# Patient Record
Sex: Female | Born: 1935 | Race: White | Hispanic: No | Smoking: Never smoker
Health system: Southern US, Community
[De-identification: ages and names within clinical notes are randomized; demographics above are authoritative.]

## PROBLEM LIST (undated history)

## (undated) DIAGNOSIS — F419 Anxiety disorder, unspecified: Secondary | ICD-10-CM

## (undated) DIAGNOSIS — K922 Gastrointestinal hemorrhage, unspecified: Secondary | ICD-10-CM

## (undated) DIAGNOSIS — J690 Pneumonitis due to inhalation of food and vomit: Secondary | ICD-10-CM

## (undated) DIAGNOSIS — R002 Palpitations: Secondary | ICD-10-CM

## (undated) DIAGNOSIS — N390 Urinary tract infection, site not specified: Secondary | ICD-10-CM

## (undated) DIAGNOSIS — M1990 Unspecified osteoarthritis, unspecified site: Secondary | ICD-10-CM

## (undated) DIAGNOSIS — I10 Essential (primary) hypertension: Secondary | ICD-10-CM

## (undated) DIAGNOSIS — E785 Hyperlipidemia, unspecified: Secondary | ICD-10-CM

## (undated) DIAGNOSIS — T7840XA Allergy, unspecified, initial encounter: Secondary | ICD-10-CM

## (undated) DIAGNOSIS — R739 Hyperglycemia, unspecified: Secondary | ICD-10-CM

## (undated) DIAGNOSIS — K219 Gastro-esophageal reflux disease without esophagitis: Secondary | ICD-10-CM

## (undated) DIAGNOSIS — IMO0002 Reserved for concepts with insufficient information to code with codable children: Secondary | ICD-10-CM

## (undated) DIAGNOSIS — I639 Cerebral infarction, unspecified: Secondary | ICD-10-CM

## (undated) DIAGNOSIS — K5790 Diverticulosis of intestine, part unspecified, without perforation or abscess without bleeding: Secondary | ICD-10-CM

## (undated) HISTORY — DX: Essential (primary) hypertension: I10

## (undated) HISTORY — DX: Gastrointestinal hemorrhage, unspecified: K92.2

## (undated) HISTORY — DX: Anxiety disorder, unspecified: F41.9

## (undated) HISTORY — DX: Allergy, unspecified, initial encounter: T78.40XA

## (undated) HISTORY — DX: Unspecified osteoarthritis, unspecified site: M19.90

## (undated) HISTORY — PX: CHOLECYSTECTOMY: SHX55

## (undated) HISTORY — DX: Reserved for concepts with insufficient information to code with codable children: IMO0002

## (undated) HISTORY — PX: HEMORROIDECTOMY: SUR656

## (undated) HISTORY — DX: Palpitations: R00.2

## (undated) HISTORY — DX: Pneumonitis due to inhalation of food and vomit: J69.0

## (undated) HISTORY — DX: Gastro-esophageal reflux disease without esophagitis: K21.9

## (undated) HISTORY — DX: Diverticulosis of intestine, part unspecified, without perforation or abscess without bleeding: K57.90

## (undated) HISTORY — DX: Hyperlipidemia, unspecified: E78.5

## (undated) HISTORY — DX: Urinary tract infection, site not specified: N39.0

## (undated) HISTORY — PX: ABDOMINAL HYSTERECTOMY: SHX81

## (undated) HISTORY — DX: Hyperglycemia, unspecified: R73.9

---

## 2003-09-25 LAB — HM COLONOSCOPY: HM Colonoscopy: NORMAL

## 2004-05-01 ENCOUNTER — Ambulatory Visit: Payer: Self-pay | Admitting: Unknown Physician Specialty

## 2004-12-04 ENCOUNTER — Ambulatory Visit: Payer: Self-pay | Admitting: Ophthalmology

## 2004-12-09 ENCOUNTER — Ambulatory Visit: Payer: Self-pay | Admitting: Ophthalmology

## 2005-06-26 ENCOUNTER — Ambulatory Visit: Payer: Self-pay | Admitting: Unknown Physician Specialty

## 2006-09-22 ENCOUNTER — Ambulatory Visit: Payer: Self-pay | Admitting: Unknown Physician Specialty

## 2007-05-09 ENCOUNTER — Ambulatory Visit: Payer: Self-pay | Admitting: Urology

## 2007-10-06 ENCOUNTER — Ambulatory Visit: Payer: Self-pay | Admitting: Unknown Physician Specialty

## 2008-06-16 ENCOUNTER — Inpatient Hospital Stay: Payer: Self-pay | Admitting: Internal Medicine

## 2008-10-16 ENCOUNTER — Ambulatory Visit: Payer: Self-pay | Admitting: Unknown Physician Specialty

## 2009-07-10 ENCOUNTER — Ambulatory Visit: Payer: Self-pay | Admitting: Ophthalmology

## 2009-10-24 ENCOUNTER — Ambulatory Visit: Payer: Self-pay | Admitting: Unknown Physician Specialty

## 2010-09-03 ENCOUNTER — Ambulatory Visit: Payer: Self-pay | Admitting: Unknown Physician Specialty

## 2010-10-21 LAB — TSH: TSH: 1.95 u[IU]/mL (ref <=5.90)

## 2010-11-20 ENCOUNTER — Ambulatory Visit: Payer: Self-pay | Admitting: Unknown Physician Specialty

## 2011-07-01 ENCOUNTER — Encounter: Payer: Self-pay | Admitting: Internal Medicine

## 2011-07-01 ENCOUNTER — Ambulatory Visit (INDEPENDENT_AMBULATORY_CARE_PROVIDER_SITE_OTHER): Payer: Medicare Other | Admitting: Internal Medicine

## 2011-07-01 VITALS — BP 146/80 | HR 76 | Temp 97.6°F | Ht 59.0 in | Wt 149.0 lb

## 2011-07-01 DIAGNOSIS — I10 Essential (primary) hypertension: Secondary | ICD-10-CM | POA: Insufficient documentation

## 2011-07-01 DIAGNOSIS — R0989 Other specified symptoms and signs involving the circulatory and respiratory systems: Secondary | ICD-10-CM

## 2011-07-01 DIAGNOSIS — D649 Anemia, unspecified: Secondary | ICD-10-CM | POA: Insufficient documentation

## 2011-07-01 DIAGNOSIS — E785 Hyperlipidemia, unspecified: Secondary | ICD-10-CM | POA: Insufficient documentation

## 2011-07-01 DIAGNOSIS — Z79899 Other long term (current) drug therapy: Secondary | ICD-10-CM

## 2011-07-01 DIAGNOSIS — G5700 Lesion of sciatic nerve, unspecified lower limb: Secondary | ICD-10-CM

## 2011-07-01 DIAGNOSIS — M5430 Sciatica, unspecified side: Secondary | ICD-10-CM

## 2011-07-01 DIAGNOSIS — R0600 Dyspnea, unspecified: Secondary | ICD-10-CM | POA: Insufficient documentation

## 2011-07-01 LAB — COMPREHENSIVE METABOLIC PANEL
Albumin: 3.9 g/dL (ref 3.5–5.2)
BUN: 17 mg/dL (ref 6–23)
CO2: 28 mEq/L (ref 19–32)
Calcium: 8.7 mg/dL (ref 8.4–10.5)
Chloride: 111 mEq/L (ref 96–112)
Creatinine, Ser: 0.9 mg/dL (ref 0.4–1.2)
GFR: 63.66 mL/min (ref >60.00)
Potassium: 3.8 mEq/L (ref 3.5–5.1)

## 2011-07-01 LAB — LIPID PANEL
HDL: 36.5 mg/dL — ABNORMAL LOW (ref >39.00)
Triglycerides: 111 mg/dL (ref 0.0–149.0)

## 2011-07-01 LAB — CBC WITH DIFFERENTIAL/PLATELET
Basophils Relative: 0.4 % (ref 0.0–3.0)
Eosinophils Relative: 1.6 % (ref 0.0–5.0)
HCT: 39.1 % (ref 36.0–46.0)
Hemoglobin: 12.6 g/dL (ref 12.0–15.0)
Lymphs Abs: 1 10*3/uL (ref 0.7–4.0)
MCV: 75.3 fl — ABNORMAL LOW (ref 78.0–100.0)
Monocytes Absolute: 0.2 10*3/uL (ref 0.1–1.0)
Neutro Abs: 2.7 10*3/uL (ref 1.4–7.7)
RBC: 5.19 Mil/uL — ABNORMAL HIGH (ref 3.87–5.11)
WBC: 4.1 10*3/uL — ABNORMAL LOW (ref 4.5–10.5)

## 2011-07-01 MED ORDER — METOPROLOL SUCCINATE ER 25 MG PO TB24: 25.0000 mg | ORAL_TABLET | Freq: Every day | ORAL | Status: DC

## 2011-07-01 NOTE — Progress Notes (Signed)
Subjective:    Patient ID: Anna [name], female    DOB: Jul 03, 1922, 77 y.o.   MRN: [id number]  HPI 77YO female with HTN presents to establish care. Her primary concern today is 2 month history of dyspnea on exertion.  She reports that her previous physician evaluated her for this with EKG, ECHO, stress test, labwork and CXR. She reports evaluation was normal.   She denies any chest pain, palpitations, cough. She denies any dyspnea at rest. She denies any fever, chills, congestion, sneezing. She is not a smoker and has no h/o tobacco exposure.  She is also concerned today about h/o anemia. Notes that her previous PCP diagnosed her with anemia. She is unsure what her hemoglobin was or what the cause of her anemia was. She denies black stool, blood in her stool, abdominal pain, or other complaints.  She notes a several month h/o left sided hip pain that radiates to her left posterior thigh. This is made worse with ambulation. She denies any weakness in her left leg. She has been using Ibuprofen 400mg  1-2 times per day with relief of her symptoms.  Outpatient Encounter Prescriptions as of 07/01/2011  Medication Sig Dispense Refill  . aspirin EC 81 MG tablet Take 81 mg by mouth daily.        . Calcium Carbonate-Vitamin D (CALCIUM + D PO) Take by mouth daily.        . ferrous fumarate-iron polysaccharide complex (TANDEM) 162-115.2 MG CAPS Take 1 capsule by mouth daily with breakfast.        . metoprolol succinate (TOPROL-XL) 25 MG 24 hr tablet Take 1 tablet (25 mg total) by mouth daily.  30 tablet  6  . Multiple Vitamins-Minerals (MULTIVITAMIN WITH MINERALS) tablet Take 1 tablet by mouth daily.        . pantoprazole (PROTONIX) 40 MG tablet Take 40 mg by mouth daily.          Review of Systems  Constitutional: Negative for fever, chills, appetite change, fatigue and unexpected weight change.  HENT: Negative for ear pain, congestion, sore throat, trouble swallowing, neck pain, voice change and sinus  pressure.   Eyes: Negative for visual disturbance.  Respiratory: Positive for shortness of breath (on exertion). Negative for cough, chest tightness, wheezing and stridor.   Cardiovascular: Negative for chest pain, palpitations and leg swelling.  Gastrointestinal: Negative for nausea, vomiting, abdominal pain, diarrhea, constipation, blood in stool, abdominal distention and anal bleeding.  Genitourinary: Negative for dysuria and flank pain.  Musculoskeletal: Positive for arthralgias (left hip radiating down left posterior thigh). Negative for myalgias and gait problem.  Skin: Negative for color change and rash.  Neurological: Negative for dizziness and headaches.  Hematological: Negative for adenopathy. Does not bruise/bleed easily.  Psychiatric/Behavioral: Negative for suicidal ideas, sleep disturbance and dysphoric mood. The patient is not nervous/anxious.    BP 146/80  Pulse 76  Temp(Src) 97.6 F (36.4 C) (Oral)  Ht 4\' 11"  (1.499 m)  Wt 149 lb (67.586 kg)  BMI 30.09 kg/m2  SpO2 96%     Objective:   Physical Exam  Constitutional: She is oriented to person, place, and time. She appears well-developed and well-nourished. No distress.  HENT:  Head: Normocephalic and atraumatic.  Right Ear: External ear normal.  Left Ear: External ear normal.  Nose: Nose normal.  Mouth/Throat: Oropharynx is clear and moist. No oropharyngeal exudate.  Eyes: Conjunctivae are normal. Pupils are equal, round, and reactive to light. Right eye exhibits no discharge. Left eye  exhibits no discharge. No scleral icterus.  Neck: Normal range of motion. Neck supple. No tracheal deviation present. No thyromegaly present.  Cardiovascular: Normal rate, regular rhythm, normal heart sounds and intact distal pulses.  Exam reveals no gallop and no friction rub.   No murmur heard. Pulmonary/Chest: Effort normal and breath sounds normal. No respiratory distress. She has no wheezes. She has no rales. She exhibits no  tenderness.  Musculoskeletal: Normal range of motion. She exhibits no edema and no tenderness.  Lymphadenopathy:    She has no cervical adenopathy.  Neurological: She is alert and oriented to person, place, and time. No cranial nerve deficit. She exhibits normal muscle tone. Coordination normal.  Skin: Skin is warm and dry. No rash noted. She is not diaphoretic. No erythema. No pallor.  Psychiatric: She has a normal mood and affect. Her behavior is normal. Judgment and thought content normal.          Assessment & Plan:  1. Dyspnea - Pt reports workup through previous physician including labs, ECHO, EKG which she states were normal. Will request these records. She also notes a h/o anemia, which may be contributing. Will request records on this. Question if this may be deconditioning. Sats maintained during walk test today.  Will get records and labs, see her back in 2 weeks.  2. Anemia - Per pt h/o anemia. Will request records on this. Will check CBC, CMP, B12, ferritin with labs today. Follow up 2 weeks.  3. Hypertension - BP fairly well controlled with metoprolol. Will continue. Will check renal function with labs today. Follow up 2 weeks.  4. Left sciatic pain - PT is using ibuprofen with improvement in symptoms.  Will continue. Encouraged her to use this as sparingly as possible given risk of GI upset and renal dysfunction.

## 2011-07-08 ENCOUNTER — Ambulatory Visit: Payer: Medicare Other | Admitting: Internal Medicine

## 2011-07-09 ENCOUNTER — Ambulatory Visit (INDEPENDENT_AMBULATORY_CARE_PROVIDER_SITE_OTHER): Payer: Medicare Other | Admitting: Internal Medicine

## 2011-07-09 ENCOUNTER — Encounter: Payer: Self-pay | Admitting: Internal Medicine

## 2011-07-09 VITALS — BP 146/86 | HR 71 | Temp 98.5°F | Wt 149.0 lb

## 2011-07-09 DIAGNOSIS — M5430 Sciatica, unspecified side: Secondary | ICD-10-CM

## 2011-07-09 DIAGNOSIS — F419 Anxiety disorder, unspecified: Secondary | ICD-10-CM | POA: Insufficient documentation

## 2011-07-09 DIAGNOSIS — D649 Anemia, unspecified: Secondary | ICD-10-CM

## 2011-07-09 DIAGNOSIS — G5700 Lesion of sciatic nerve, unspecified lower limb: Secondary | ICD-10-CM

## 2011-07-09 DIAGNOSIS — F411 Generalized anxiety disorder: Secondary | ICD-10-CM

## 2011-07-09 MED ORDER — CITALOPRAM HYDROBROMIDE 20 MG PO TABS: 20.0000 mg | ORAL_TABLET | Freq: Every day | ORAL | Status: DC

## 2011-07-09 NOTE — Patient Instructions (Signed)

## 2011-07-09 NOTE — Progress Notes (Signed)
Subjective:    Patient ID: Anna [name], female    DOB: [birth year], 77 y.o.   MRN: [id number]  HPI 77 year old female with a history of iron deficiency presents for followup. On her recent lab work, she was noted to have low ferritin level of 8. Her hemoglobin was stable at 12. She reports that she has been taking her iron supplementation at lunch time. She does take this with her calcium supplement. She reports some fatigue. This has been present for several months. She notes that her previous physician did an extensive cardiovascular and pulmonary evaluation which was normal. She reports that her previous physician attributed her symptoms to anemia and started her on iron supplementation. She does note that her diet is poor and often does not include sources of iron such as lean protein or vegetables. She has not noticed any blood in her stool, however does report occasional black stool. She attributes this to the use of her iron supplement. She reports that her last colonoscopy was in 2005 and was normal. She does not have a family history of colon cancer. She has not had any nausea or vomiting. She has not had any weight loss.  She is also concerned today about worsening anxiety. She noticed recent increase in her anxiety and dealing with some family situations. She reports that she worries excessively and this interferes with her daily activities. It sometimes limits her ability to sleep. She is interested in considering medication to help with her anxiety.  She is also concerned today about left-sided sciatic pain. She reports that this has been chronic and intermittent for several years. The pain radiates down the back of her left leg. She notes that she typically gets improvement after chiropractic manipulation. She reports that she has followup with her chiropractor today. In the interim, she has been using ibuprofen as needed and has been applying ice. This is given her minimal  improvement.  Outpatient Encounter Prescriptions as of 07/09/2011  Medication Sig Dispense Refill  . aspirin EC 81 MG tablet Take 81 mg by mouth daily.        . Calcium Carbonate-Vitamin D (CALCIUM + D PO) Take by mouth daily.        . ferrous fumarate-iron polysaccharide complex (TANDEM) 162-115.2 MG CAPS Take 1 capsule by mouth daily with breakfast.        . metoprolol succinate (TOPROL-XL) 25 MG 24 hr tablet Take 1 tablet (25 mg total) by mouth daily.  30 tablet  6  . Multiple Vitamins-Minerals (MULTIVITAMIN WITH MINERALS) tablet Take 1 tablet by mouth daily.        . pantoprazole (PROTONIX) 40 MG tablet Take 40 mg by mouth daily.          Review of Systems  Constitutional: Positive for fatigue. Negative for fever, chills, appetite change and unexpected weight change.  HENT: Negative for ear pain, congestion, sore throat, trouble swallowing, neck pain, voice change and sinus pressure.   Eyes: Negative for visual disturbance.  Respiratory: Negative for cough, shortness of breath, wheezing and stridor.   Cardiovascular: Negative for chest pain, palpitations and leg swelling.  Gastrointestinal: Negative for nausea, vomiting, abdominal pain, diarrhea, constipation, blood in stool, abdominal distention and anal bleeding.  Genitourinary: Negative for dysuria and flank pain.  Musculoskeletal: Positive for myalgias, back pain and arthralgias. Negative for gait problem.  Skin: Negative for color change and rash.  Neurological: Negative for dizziness and headaches.  Hematological: Negative for adenopathy. Does not bruise/bleed easily.  Psychiatric/Behavioral: Negative for suicidal ideas, sleep disturbance and dysphoric mood. The patient is nervous/anxious.    BP 146/86  Pulse 71  Temp(Src) 98.5 F (36.9 C) (Oral)  Wt 149 lb (67.586 kg)  SpO2 98%     Objective:   Physical Exam  Constitutional: She is oriented to person, place, and time. She appears well-developed and well-nourished. No  distress.  HENT:  Head: Normocephalic and atraumatic.  Right Ear: External ear normal.  Left Ear: External ear normal.  Nose: Nose normal.  Mouth/Throat: Oropharynx is clear and moist. No oropharyngeal exudate.  Eyes: Conjunctivae are normal. Pupils are equal, round, and reactive to light. Right eye exhibits no discharge. Left eye exhibits no discharge. No scleral icterus.  Neck: Normal range of motion. Neck supple. No tracheal deviation present. No thyromegaly present.  Cardiovascular: Normal rate, regular rhythm, normal heart sounds and intact distal pulses.  Exam reveals no gallop and no friction rub.   No murmur heard. Pulmonary/Chest: Effort normal and breath sounds normal. No respiratory distress. She has no wheezes. She has no rales. She exhibits no tenderness.  Genitourinary: Rectal exam shows external hemorrhoid. Rectal exam shows no fissure, no mass, no tenderness and anal tone normal. Guaiac negative stool.  Musculoskeletal: Normal range of motion. She exhibits no edema and no tenderness.       Left hip: She exhibits normal range of motion and normal strength.       Lumbar back: She exhibits pain.  Lymphadenopathy:    She has no cervical adenopathy.  Neurological: She is alert and oriented to person, place, and time. No cranial nerve deficit. She exhibits normal muscle tone. Coordination normal.  Skin: Skin is warm and dry. No rash noted. She is not diaphoretic. No erythema. No pallor.  Psychiatric: Her behavior is normal. Judgment and thought content normal. Her mood appears anxious. Her speech is rapid and/or pressured. Cognition and memory are normal.          Assessment & Plan:  1. Iron deficiency -stool guaiac today was negative. Suspect that she has ongoing iron deficiency because of poor diet. Will continue iron supplementation. I've encouraged her not to take this medication at the same time that she takes her calcium supplements as this may be interfering with  absorption. We will repeat her blood counts and ferritin level in 3 months.  2. Anxiety -severe. Will start Celexa 20 mg daily. We will have her followup in one month for repeat evaluation.  3. Sciatic pain left side -chronic. She reports that she has followup with her chiropractor today. If no improvement with this, would consider referral to pain management specialist. Would prefer not to use oral steroids given her potential risk of increasing her anxiety.

## 2011-08-10 ENCOUNTER — Ambulatory Visit: Payer: Medicare Other | Admitting: Internal Medicine

## 2011-08-17 ENCOUNTER — Ambulatory Visit (INDEPENDENT_AMBULATORY_CARE_PROVIDER_SITE_OTHER): Payer: Medicare Other | Admitting: Internal Medicine

## 2011-08-17 ENCOUNTER — Encounter: Payer: Self-pay | Admitting: Internal Medicine

## 2011-08-17 VITALS — BP 142/80 | HR 87 | Temp 97.9°F | Ht 59.0 in | Wt 147.0 lb

## 2011-08-17 DIAGNOSIS — M5430 Sciatica, unspecified side: Secondary | ICD-10-CM

## 2011-08-17 DIAGNOSIS — D649 Anemia, unspecified: Secondary | ICD-10-CM

## 2011-08-17 DIAGNOSIS — F411 Generalized anxiety disorder: Secondary | ICD-10-CM

## 2011-08-17 DIAGNOSIS — F419 Anxiety disorder, unspecified: Secondary | ICD-10-CM

## 2011-08-17 NOTE — Assessment & Plan Note (Signed)
Currently followed by chiropractor. Patient reports symptoms are stable. We discussed possibly setting her up with PT but she would like to defer for now. Followup in one month.

## 2011-08-17 NOTE — Assessment & Plan Note (Signed)
Symptoms improved with use of Celexa. We'll plan to continue. Followup in one month.

## 2011-08-17 NOTE — Assessment & Plan Note (Signed)
Patient notes that symptoms of fatigue and dyspnea have improved with starting iron supplementation. We'll plan to repeat CBC next month with visit.

## 2011-08-17 NOTE — Progress Notes (Signed)
Subjective:    Patient ID: Anna [name], female    DOB: 03-14-1922, 78 y.o.   MRN: [id number]  HPI 78 year old female with history of anxiety, anemia, and sciatic pain presents for followup. In regards to her anxiety, she notes some improvement in her symptoms since starting Celexa. She denies any noted side effects from the medication. She continues to experience some anxiety, however does not wish to try increasing her dose of Celexa. She continues to have some episodes of increased worrying, mostly related to family issues.  In regards to her anemia, she notes that since starting iron supplementation she has had some improvement in her energy level. She has also had some improvement in her shortness of breath, however occasionally notices this on exertion.  In regards to her sciatic pain, she has been followed by a chiropractor, but has not recently attended therapy because of cost. She reports that her symptoms are stable, she has not had any recent falls or weakness in her legs.  Outpatient Encounter Prescriptions as of 08/17/2011  Medication Sig Dispense Refill  . aspirin EC 81 MG tablet Take 81 mg by mouth daily.        . Calcium Carbonate-Vitamin D (CALCIUM + D PO) Take by mouth daily.        . citalopram (CELEXA) 20 MG tablet Take 1 tablet (20 mg total) by mouth daily.  30 tablet  3  . ferrous fumarate-iron polysaccharide complex (TANDEM) 162-115.2 MG CAPS Take 1 capsule by mouth daily with breakfast.        . metoprolol succinate (TOPROL-XL) 25 MG 24 hr tablet Take 1 tablet (25 mg total) by mouth daily.  30 tablet  6  . Multiple Vitamins-Minerals (MULTIVITAMIN WITH MINERALS) tablet Take 1 tablet by mouth daily.        . pantoprazole (PROTONIX) 40 MG tablet Take 40 mg by mouth daily.          Review of Systems  Constitutional: Negative for fever, chills, appetite change, fatigue and unexpected weight change.  HENT: Negative for ear pain, congestion, sore throat, trouble swallowing,  neck pain, voice change and sinus pressure.   Eyes: Negative for visual disturbance.  Respiratory: Negative for cough, shortness of breath, wheezing and stridor.   Cardiovascular: Negative for chest pain, palpitations and leg swelling.  Gastrointestinal: Negative for nausea, vomiting, abdominal pain, diarrhea, constipation, blood in stool, abdominal distention and anal bleeding.  Genitourinary: Negative for dysuria and flank pain.  Musculoskeletal: Positive for myalgias, back pain and arthralgias. Negative for gait problem.  Skin: Negative for color change and rash.  Neurological: Negative for dizziness and headaches.  Hematological: Negative for adenopathy. Does not bruise/bleed easily.  Psychiatric/Behavioral: Negative for suicidal ideas, sleep disturbance and dysphoric mood. The patient is nervous/anxious.    BP 142/80  Pulse 87  Temp(Src) 97.9 F (36.6 C) (Oral)  Ht 4\' 11"  (1.499 m)  Wt 147 lb (66.679 kg)  BMI 29.69 kg/m2  SpO2 96%     Objective:   Physical Exam  Constitutional: She is oriented to person, place, and time. She appears well-developed and well-nourished. No distress.  HENT:  Head: Normocephalic and atraumatic.  Right Ear: External ear normal.  Left Ear: External ear normal.  Nose: Nose normal.  Mouth/Throat: Oropharynx is clear and moist. No oropharyngeal exudate.  Eyes: Conjunctivae are normal. Pupils are equal, round, and reactive to light. Right eye exhibits no discharge. Left eye exhibits no discharge. No scleral icterus.  Neck: Normal range of motion.  Neck supple. No tracheal deviation present. No thyromegaly present.  Cardiovascular: Normal rate, regular rhythm, normal heart sounds and intact distal pulses.  Exam reveals no gallop and no friction rub.   No murmur heard. Pulmonary/Chest: Effort normal and breath sounds normal. No respiratory distress. She has no wheezes. She has no rales. She exhibits no tenderness.  Musculoskeletal: Normal range of motion.  She exhibits no edema and no tenderness.  Lymphadenopathy:    She has no cervical adenopathy.  Neurological: She is alert and oriented to person, place, and time. No cranial nerve deficit. She exhibits normal muscle tone. Coordination normal.  Skin: Skin is warm and dry. No rash noted. She is not diaphoretic. No erythema. No pallor.  Psychiatric: She has a normal mood and affect. Her behavior is normal. Judgment and thought content normal.          Assessment & Plan:

## 2011-08-26 ENCOUNTER — Encounter: Payer: Self-pay | Admitting: Internal Medicine

## 2011-08-26 DIAGNOSIS — K625 Hemorrhage of anus and rectum: Secondary | ICD-10-CM

## 2011-09-21 ENCOUNTER — Other Ambulatory Visit: Payer: Medicare Other | Admitting: *Deleted

## 2011-09-21 DIAGNOSIS — D649 Anemia, unspecified: Secondary | ICD-10-CM

## 2011-09-21 LAB — CBC WITH DIFFERENTIAL/PLATELET
Basophils Absolute: 0 10*3/uL (ref 0.0–0.1)
Basophils Relative: 0 % (ref 0–1)
Eosinophils Absolute: 0.1 10*3/uL (ref 0.0–0.7)
Eosinophils Relative: 1 % (ref 0–5)
HCT: 44.1 % (ref 36.0–46.0)
MCHC: 30.8 g/dL (ref 30.0–36.0)
MCV: 78.6 fL (ref 78.0–100.0)
Monocytes Absolute: 0.3 10*3/uL (ref 0.1–1.0)
Neutro Abs: 3.2 10*3/uL (ref 1.7–7.7)
RDW: 15.6 % — ABNORMAL HIGH (ref 11.5–15.5)

## 2011-09-25 ENCOUNTER — Ambulatory Visit (INDEPENDENT_AMBULATORY_CARE_PROVIDER_SITE_OTHER): Payer: Medicare Other | Admitting: Internal Medicine

## 2011-09-25 ENCOUNTER — Encounter: Payer: Self-pay | Admitting: Internal Medicine

## 2011-09-25 VITALS — BP 160/78 | HR 97 | Temp 97.3°F | Wt 150.0 lb

## 2011-09-25 DIAGNOSIS — Z0000 Encounter for general adult medical examination without abnormal findings: Secondary | ICD-10-CM

## 2011-09-25 DIAGNOSIS — I10 Essential (primary) hypertension: Secondary | ICD-10-CM

## 2011-09-25 DIAGNOSIS — D649 Anemia, unspecified: Secondary | ICD-10-CM

## 2011-09-25 DIAGNOSIS — F419 Anxiety disorder, unspecified: Secondary | ICD-10-CM

## 2011-09-25 DIAGNOSIS — F411 Generalized anxiety disorder: Secondary | ICD-10-CM

## 2011-09-25 NOTE — Progress Notes (Signed)
Subjective:    Patient ID: Anna [name], female    DOB: November 03, 1921, 78 y.o.   MRN: [id number]  HPI The patient is here for annual Medicare wellness examination and management of other chronic and acute problems.   The risk factors are reflected in the social history.  The roster of all physicians providing medical care to patient - is listed in the Snapshot section of the chart.  Activities of daily living:  The patient is 100% independent in all ADLs: dressing, toileting, feeding as well as independent mobility.  Home safety : The patient has smoke detectors in the home. They wear seatbelts.No firearms at home. There is no violence in the home.   There is no risks for hepatitis, STDs or HIV. There is no history of blood transfusion. They have no travel history to infectious disease endemic areas of the world.  The patient has seen their dentist in the last six month. They have seen their eye doctor in the last year. They deny any hearing difficulty and have not had audiologic testing in the last year.  They do not  have excessive sun exposure. Discussed the need for sun protection: hats, long sleeves and use of sunscreen if there is significant sun exposure.   Diet: the importance of a healthy diet is discussed. They have a unhealthy-high fat/fast food diet.  The patient does not exercise. Encouraged her to rejoin the [facility].  The benefits of regular aerobic exercise were discussed.  Depression screen: there are no signs or vegative symptoms of depression- irritability, change in appetite, anhedonia, sadness/tearfullness. Pt does have significant anxiety.  Cognitive assessment: the patient manages all their financial and personal affairs and is actively engaged. They could relate day,date,year and events; recalled 3/3 objects at 3 minutes; performed clock-face test normally.  The following portions of the patient's history were reviewed and updated as appropriate: allergies, current  medications, past family history, past medical history,  past surgical history, past social history  and problem list.  Vision, hearing, body mass index were assessed and reviewed.   During the course of the visit the patient was educated and counseled about appropriate screening and preventive services including : fall prevention , diabetes screening, nutrition counseling, colorectal cancer screening, and recommended immunizations.    Outpatient Encounter Prescriptions as of 09/25/2011  Medication Sig Dispense Refill  . amoxicillin (AMOXIL) 500 MG capsule Take 500 mg by mouth 3 (three) times daily.      Marland Kitchen aspirin EC 81 MG tablet Take 81 mg by mouth daily.        . Calcium Carbonate-Vitamin D (CALCIUM + D PO) Take by mouth daily.        . citalopram (CELEXA) 20 MG tablet Take 1 tablet (20 mg total) by mouth daily.  30 tablet  3  . ferrous fumarate-iron polysaccharide complex (TANDEM) 162-115.2 MG CAPS Take 1 capsule by mouth daily with breakfast.        . metoprolol succinate (TOPROL-XL) 25 MG 24 hr tablet Take 1 tablet (25 mg total) by mouth daily.  30 tablet  6  . Multiple Vitamins-Minerals (MULTIVITAMIN WITH MINERALS) tablet Take 1 tablet by mouth daily.        . pantoprazole (PROTONIX) 40 MG tablet Take 40 mg by mouth daily.        . Vitamin D, Ergocalciferol, (DRISDOL) 50000 UNITS CAPS Take 50,000 Units by mouth every 7 (seven) days.        Review of Systems  Constitutional: Negative  for fever, chills, appetite change, fatigue and unexpected weight change.  HENT: Negative for ear pain, congestion, sore throat, trouble swallowing, neck pain, voice change and sinus pressure.   Eyes: Negative for visual disturbance.  Respiratory: Negative for cough, shortness of breath, wheezing and stridor.   Cardiovascular: Negative for chest pain, palpitations and leg swelling.  Gastrointestinal: Negative for nausea, vomiting, abdominal pain, diarrhea, constipation, blood in stool, abdominal distention  and anal bleeding.  Genitourinary: Negative for dysuria and flank pain.  Musculoskeletal: Negative for myalgias, arthralgias and gait problem.  Skin: Negative for color change and rash.  Neurological: Negative for dizziness and headaches.  Hematological: Negative for adenopathy. Does not bruise/bleed easily.  Psychiatric/Behavioral: Negative for suicidal ideas, sleep disturbance and dysphoric mood. The patient is nervous/anxious.    BP 160/78  Pulse 97  Temp(Src) 97.3 F (36.3 C) (Oral)  Wt 150 lb (68.04 kg)  SpO2 97%     Objective:   Physical Exam  Constitutional: She is oriented to person, place, and time. She appears well-developed and well-nourished. No distress.  HENT:  Head: Normocephalic and atraumatic.  Right Ear: External ear normal.  Left Ear: External ear normal.  Nose: Nose normal.  Mouth/Throat: Oropharynx is clear and moist. No oropharyngeal exudate.  Eyes: Conjunctivae are normal. Pupils are equal, round, and reactive to light. Right eye exhibits no discharge. Left eye exhibits no discharge. No scleral icterus.  Neck: Normal range of motion. Neck supple. No tracheal deviation present. No thyromegaly present.  Cardiovascular: Normal rate, regular rhythm, normal heart sounds and intact distal pulses.  Exam reveals no gallop and no friction rub.   No murmur heard. Pulmonary/Chest: Effort normal and breath sounds normal. No respiratory distress. She has no wheezes. She has no rales. She exhibits no tenderness.  Abdominal: Soft. Bowel sounds are normal. She exhibits no distension and no mass. There is no tenderness. There is no rebound and no guarding.  Musculoskeletal: Normal range of motion. She exhibits no edema and no tenderness.  Lymphadenopathy:    She has no cervical adenopathy.  Neurological: She is alert and oriented to person, place, and time. No cranial nerve deficit. She exhibits normal muscle tone. Coordination normal.  Skin: Skin is warm and dry. No rash  noted. She is not diaphoretic. No erythema. No pallor.  Psychiatric: Judgment and thought content normal. Her mood appears anxious. Her speech is tangential. She is is hyperactive. Cognition and memory are normal.          Assessment & Plan:

## 2011-09-26 DIAGNOSIS — Z0000 Encounter for general adult medical examination without abnormal findings: Secondary | ICD-10-CM | POA: Insufficient documentation

## 2011-09-26 NOTE — Assessment & Plan Note (Signed)
Patient never checked stool Hemoccult testing after this was mailed to her home. We'll give her a new kit today to check for blood. Continue iron supplementation. Followup 3 months.

## 2011-09-26 NOTE — Assessment & Plan Note (Signed)
Severe. Pt not taking Celexa on a regular basis. Encouraged better compliance with this medication. Followup 3 months.

## 2011-09-26 NOTE — Assessment & Plan Note (Signed)
General exam is normal today. Patient is up-to-date on health screening. She refuses further mammograms. Last colonoscopy 2005. Immunizations are up to date. Vision and hearing screening are normal.

## 2011-09-26 NOTE — Assessment & Plan Note (Signed)
Blood pressure was initially elevated on arrival to clinic, however then improved at the end of her visit. Blood pressure likely affected by significant anxiety. As below, encouraged better compliance with Celexa. She will followup in 3 months.

## 2011-11-11 ENCOUNTER — Ambulatory Visit (INDEPENDENT_AMBULATORY_CARE_PROVIDER_SITE_OTHER): Payer: Medicare Other | Admitting: *Deleted

## 2011-11-11 ENCOUNTER — Other Ambulatory Visit: Payer: Self-pay | Admitting: *Deleted

## 2011-11-11 DIAGNOSIS — N390 Urinary tract infection, site not specified: Secondary | ICD-10-CM

## 2011-11-11 LAB — POCT URINALYSIS DIPSTICK
Bilirubin, UA: NEGATIVE
Glucose, UA: NEGATIVE
Nitrite, UA: NEGATIVE

## 2011-11-11 MED ORDER — SULFAMETHOXAZOLE-TRIMETHOPRIM 800-160 MG PO TABS: 1.0000 | ORAL_TABLET | Freq: Two times a day (BID) | ORAL | Status: AC

## 2011-11-14 LAB — URINE CULTURE: Colony Count: >=100000

## 2011-12-08 ENCOUNTER — Ambulatory Visit (INDEPENDENT_AMBULATORY_CARE_PROVIDER_SITE_OTHER): Payer: Medicare Other | Admitting: Internal Medicine

## 2011-12-08 DIAGNOSIS — N390 Urinary tract infection, site not specified: Secondary | ICD-10-CM

## 2011-12-08 LAB — POCT URINALYSIS DIPSTICK
Nitrite, UA: NEGATIVE
Spec Grav, UA: 1.01
Urobilinogen, UA: 0.2
pH, UA: 5

## 2011-12-08 NOTE — Progress Notes (Signed)
  Subjective:    Patient ID: Anna [name], female    DOB: 23-Sep-1921, 78 y.o.   MRN: [id number]  HPI    Review of Systems     Objective:   Physical Exam        Assessment & Plan:  Nurse only

## 2011-12-09 ENCOUNTER — Telehealth: Payer: Self-pay | Admitting: *Deleted

## 2011-12-09 MED ORDER — SULFAMETHOXAZOLE-TRIMETHOPRIM 800-160 MG PO TABS: 1.0000 | ORAL_TABLET | Freq: Two times a day (BID) | ORAL | Status: AC

## 2011-12-09 NOTE — Telephone Encounter (Signed)
Bactrim DS po bid x 7 days.

## 2011-12-09 NOTE — Telephone Encounter (Signed)
This patient also came in yesterday for a urine sample. She says that she has been having some burning with urination through the weekend. I sent urine for culture. What would you like for me to call in?

## 2011-12-09 NOTE — Telephone Encounter (Signed)
Patient advised Rx sent in. 

## 2011-12-12 LAB — URINE CULTURE

## 2011-12-16 ENCOUNTER — Other Ambulatory Visit: Payer: Self-pay | Admitting: Internal Medicine

## 2011-12-16 MED ORDER — PANTOPRAZOLE SODIUM 40 MG PO TBEC: 40.0000 mg | DELAYED_RELEASE_TABLET | Freq: Every day | ORAL | Status: DC

## 2011-12-29 ENCOUNTER — Ambulatory Visit (INDEPENDENT_AMBULATORY_CARE_PROVIDER_SITE_OTHER): Payer: Medicare Other | Admitting: Internal Medicine

## 2011-12-29 ENCOUNTER — Encounter: Payer: Self-pay | Admitting: Internal Medicine

## 2011-12-29 VITALS — BP 132/80 | HR 65 | Temp 98.7°F | Ht 59.0 in | Wt 140.5 lb

## 2011-12-29 DIAGNOSIS — E039 Hypothyroidism, unspecified: Secondary | ICD-10-CM

## 2011-12-29 DIAGNOSIS — R739 Hyperglycemia, unspecified: Secondary | ICD-10-CM

## 2011-12-29 DIAGNOSIS — I10 Essential (primary) hypertension: Secondary | ICD-10-CM

## 2011-12-29 DIAGNOSIS — Z23 Encounter for immunization: Secondary | ICD-10-CM

## 2011-12-29 DIAGNOSIS — K219 Gastro-esophageal reflux disease without esophagitis: Secondary | ICD-10-CM

## 2011-12-29 DIAGNOSIS — M5430 Sciatica, unspecified side: Secondary | ICD-10-CM

## 2011-12-29 DIAGNOSIS — D509 Iron deficiency anemia, unspecified: Secondary | ICD-10-CM

## 2011-12-29 DIAGNOSIS — R7309 Other abnormal glucose: Secondary | ICD-10-CM

## 2011-12-29 DIAGNOSIS — R413 Other amnesia: Secondary | ICD-10-CM

## 2011-12-29 LAB — COMPREHENSIVE METABOLIC PANEL
ALT: 13 U/L (ref 0–35)
AST: 21 U/L (ref 0–37)
CO2: 24 mEq/L (ref 19–32)
Calcium: 8.5 mg/dL (ref 8.4–10.5)
Chloride: 113 mEq/L — ABNORMAL HIGH (ref 96–112)
GFR: 59.7 mL/min — ABNORMAL LOW (ref >60.00)
Potassium: 3.7 mEq/L (ref 3.5–5.1)
Sodium: 145 mEq/L (ref 135–145)
Total Protein: 6.4 g/dL (ref 6.0–8.3)

## 2011-12-29 LAB — CBC WITH DIFFERENTIAL/PLATELET
Basophils Absolute: 0 10*3/uL (ref 0.0–0.1)
Basophils Relative: 0.7 % (ref 0.0–3.0)
Eosinophils Absolute: 0.1 10*3/uL (ref 0.0–0.7)
Lymphocytes Relative: 28.5 % (ref 12.0–46.0)
MCHC: 32.3 g/dL (ref 30.0–36.0)
MCV: 78.9 fl (ref 78.0–100.0)
Monocytes Absolute: 0.2 10*3/uL (ref 0.1–1.0)
Neutro Abs: 2.4 10*3/uL (ref 1.4–7.7)
Neutrophils Relative %: 63.3 % (ref 43.0–77.0)
RBC: 4.8 Mil/uL (ref 3.87–5.11)
RDW: 17.5 % — ABNORMAL HIGH (ref 11.5–14.6)

## 2011-12-29 LAB — TSH: TSH: 1.1 u[IU]/mL (ref 0.35–5.50)

## 2011-12-29 MED ORDER — PANTOPRAZOLE SODIUM 40 MG PO TBEC: 40.0000 mg | DELAYED_RELEASE_TABLET | Freq: Every day | ORAL | Status: DC

## 2011-12-29 NOTE — Assessment & Plan Note (Signed)
Blood pressure well-controlled today. We'll continue current medications. We'll check renal function with labs. 

## 2011-12-29 NOTE — Progress Notes (Signed)
Subjective:    Patient ID: Anna [name], female    DOB: 02-21-1922, 78 y.o.   MRN: [id number]  HPI 78 year old female with history of hypertension, anxiety, iron deficiency anemia presents for followup. She reports that she is generally feeling well. She is concerned about chronic memory loss. She notes that occasionally she has difficulty managing her finances and is often forgetful. She is not currently interested in cognitive testing for further evaluation for this. Aside from this, she reports she is feeling well.  Outpatient Encounter Prescriptions as of 12/29/2011  Medication Sig Dispense Refill  . aspirin EC 81 MG tablet Take 81 mg by mouth daily.        . Calcium Carbonate-Vitamin D (CALCIUM + D PO) Take by mouth daily.        . citalopram (CELEXA) 20 MG tablet Take 1 tablet (20 mg total) by mouth daily.  30 tablet  3  . ferrous fumarate-iron polysaccharide complex (TANDEM) 162-115.2 MG CAPS Take 1 capsule by mouth daily with breakfast.        . metoprolol succinate (TOPROL-XL) 25 MG 24 hr tablet Take 1 tablet (25 mg total) by mouth daily.  30 tablet  6  . Multiple Vitamins-Minerals (MULTIVITAMIN WITH MINERALS) tablet Take 1 tablet by mouth daily.        . pantoprazole (PROTONIX) 40 MG tablet Take 1 tablet (40 mg total) by mouth daily.  30 tablet  11  . Vitamin D, Ergocalciferol, (DRISDOL) 50000 UNITS CAPS Take 50,000 Units by mouth every 7 (seven) days.      Marland Kitchen DISCONTD: pantoprazole (PROTONIX) 40 MG tablet Take 1 tablet (40 mg total) by mouth daily.  30 tablet  3  . DISCONTD: amoxicillin (AMOXIL) 500 MG capsule Take 500 mg by mouth 3 (three) times daily.        Review of Systems  Constitutional: Negative for fever, chills, appetite change, fatigue and unexpected weight change.  HENT: Negative for ear pain, congestion, sore throat, trouble swallowing, neck pain, voice change and sinus pressure.   Eyes: Negative for visual disturbance.  Respiratory: Negative for cough, shortness of  breath, wheezing and stridor.   Cardiovascular: Negative for chest pain, palpitations and leg swelling.  Gastrointestinal: Negative for nausea, vomiting, abdominal pain, diarrhea, constipation, blood in stool, abdominal distention and anal bleeding.  Genitourinary: Negative for dysuria and flank pain.  Musculoskeletal: Negative for myalgias, arthralgias and gait problem.  Skin: Negative for color change and rash.  Neurological: Negative for dizziness and headaches.  Hematological: Negative for adenopathy. Does not bruise/bleed easily.  Psychiatric/Behavioral: Positive for dysphoric mood and decreased concentration. Negative for suicidal ideas and disturbed wake/sleep cycle. The patient is nervous/anxious.    BP 132/80  Pulse 65  Temp 98.7 F (37.1 C) (Oral)  Ht 4\' 11"  (1.499 m)  Wt 140 lb 8 oz (63.73 kg)  BMI 28.38 kg/m2  SpO2 96%     Objective:   Physical Exam  Constitutional: She is oriented to person, place, and time. She appears well-developed and well-nourished. No distress.  HENT:  Head: Normocephalic and atraumatic.  Right Ear: External ear normal.  Left Ear: External ear normal.  Nose: Nose normal.  Mouth/Throat: Oropharynx is clear and moist. No oropharyngeal exudate.  Eyes: Conjunctivae are normal. Pupils are equal, round, and reactive to light. Right eye exhibits no discharge. Left eye exhibits no discharge. No scleral icterus.  Neck: Normal range of motion. Neck supple. No tracheal deviation present. No thyromegaly present.  Cardiovascular: Normal rate, regular  rhythm, normal heart sounds and intact distal pulses.  Exam reveals no gallop and no friction rub.   No murmur heard. Pulmonary/Chest: Effort normal and breath sounds normal. No respiratory distress. She has no wheezes. She has no rales. She exhibits no tenderness.  Musculoskeletal: Normal range of motion. She exhibits no edema and no tenderness.  Lymphadenopathy:    She has no cervical adenopathy.    Neurological: She is alert and oriented to person, place, and time. No cranial nerve deficit. She exhibits normal muscle tone. Coordination normal.  Skin: Skin is warm and dry. No rash noted. She is not diaphoretic. No erythema. No pallor.  Psychiatric: She has a normal mood and affect. Her speech is normal and behavior is normal. Judgment and thought content normal. Cognition and memory are normal.          Assessment & Plan:

## 2011-12-29 NOTE — Assessment & Plan Note (Signed)
Patient notes gradual memory loss. We discussed potential of referral for cognitive testing today. She would like to hold off on this. We'll continue to monitor.

## 2011-12-29 NOTE — Assessment & Plan Note (Signed)
Symptoms are stable.Will continue to monitor.

## 2011-12-29 NOTE — Assessment & Plan Note (Signed)
Patient has history of iron deficiency anemia. Will check CBC and ferritin with labs today.

## 2011-12-29 NOTE — Assessment & Plan Note (Signed)
Elevation of blood sugar noted on previous labs. Will check A1c with laboratory.

## 2012-01-25 ENCOUNTER — Telehealth: Payer: Self-pay | Admitting: Internal Medicine

## 2012-01-25 ENCOUNTER — Ambulatory Visit: Payer: Medicare Other | Admitting: Internal Medicine

## 2012-01-25 NOTE — Telephone Encounter (Signed)
Please call ms Buske about her labs results she is still not feeling well

## 2012-01-25 NOTE — Telephone Encounter (Signed)
Patient advised as instructed via telephone, she will discuss labs further at follow up appt on Wednesday.

## 2012-01-27 ENCOUNTER — Encounter: Payer: Self-pay | Admitting: Internal Medicine

## 2012-01-27 ENCOUNTER — Ambulatory Visit (INDEPENDENT_AMBULATORY_CARE_PROVIDER_SITE_OTHER): Payer: Medicare Other | Admitting: Internal Medicine

## 2012-01-27 VITALS — BP 130/90 | HR 64 | Temp 98.4°F | Ht 59.0 in | Wt 149.2 lb

## 2012-01-27 DIAGNOSIS — F419 Anxiety disorder, unspecified: Secondary | ICD-10-CM

## 2012-01-27 DIAGNOSIS — F411 Generalized anxiety disorder: Secondary | ICD-10-CM

## 2012-01-27 DIAGNOSIS — D509 Iron deficiency anemia, unspecified: Secondary | ICD-10-CM

## 2012-01-27 NOTE — Assessment & Plan Note (Signed)
Persistent anxiety especially in relation to issues with family. Offered support today. We'll continue Celexa.

## 2012-01-27 NOTE — Assessment & Plan Note (Signed)
Hemoglobin was adequate but ferritin was low on recent labs. Will send stool for Hemoccult testing. Continue iron supplementation.

## 2012-01-27 NOTE — Progress Notes (Signed)
Subjective:    Patient ID: Anna [name], female    DOB: 1922-02-09, 78 y.o.   MRN: [id number]  HPI 78 year old female with history of anxiety/depression, and iron deficiency anemia presents for followup. At her last visit, she was noted to have low ferritin of 7 on labs. She has history of GI hemorrhage twice in the past. She denies any recent episodes of abdominal pain, change in bowel habits, dark black stool or bloody stool. She has not yet completed stool testing for blood.  She again acknowledges significant anxiety and occasional dysphoric mood when worrying about her children and grandchildren. However, she feels that symptoms are relatively well controlled on current medications.  Outpatient Encounter Prescriptions as of 01/27/2012  Medication Sig Dispense Refill  . aspirin EC 81 MG tablet Take 81 mg by mouth daily.        . Calcium Carbonate-Vitamin D (CALCIUM + D PO) Take by mouth daily.        . citalopram (CELEXA) 20 MG tablet Take 1 tablet (20 mg total) by mouth daily.  30 tablet  3  . ferrous fumarate-iron polysaccharide complex (TANDEM) 162-115.2 MG CAPS Take 1 capsule by mouth daily with breakfast.        . metoprolol succinate (TOPROL-XL) 25 MG 24 hr tablet Take 1 tablet (25 mg total) by mouth daily.  30 tablet  6  . Multiple Vitamins-Minerals (MULTIVITAMIN WITH MINERALS) tablet Take 1 tablet by mouth daily.        . pantoprazole (PROTONIX) 40 MG tablet Take 1 tablet (40 mg total) by mouth daily.  30 tablet  11  . Vitamin D, Ergocalciferol, (DRISDOL) 50000 UNITS CAPS Take 50,000 Units by mouth every 7 (seven) days.       BP 130/90  Pulse 64  Temp 98.4 F (36.9 C) (Oral)  Ht 4\' 11"  (1.499 m)  Wt 149 lb 4 oz (67.699 kg)  BMI 30.14 kg/m2  SpO2 97%  Review of Systems  Constitutional: Negative for fever, chills, appetite change, fatigue and unexpected weight change.  HENT: Negative for ear pain, congestion, sore throat, trouble swallowing, neck pain, voice change and sinus  pressure.   Eyes: Negative for visual disturbance.  Respiratory: Negative for cough, shortness of breath, wheezing and stridor.   Cardiovascular: Negative for chest pain, palpitations and leg swelling.  Gastrointestinal: Negative for nausea, vomiting, abdominal pain, diarrhea, constipation, blood in stool, abdominal distention and anal bleeding.  Genitourinary: Negative for dysuria and flank pain.  Musculoskeletal: Negative for myalgias, arthralgias and gait problem.  Skin: Negative for color change and rash.  Neurological: Negative for dizziness and headaches.  Hematological: Negative for adenopathy. Does not bruise/bleed easily.  Psychiatric/Behavioral: Positive for dysphoric mood. Negative for suicidal ideas and disturbed wake/sleep cycle. The patient is nervous/anxious.        Objective:   Physical Exam  Constitutional: She is oriented to person, place, and time. She appears well-developed and well-nourished. No distress.  HENT:  Head: Normocephalic and atraumatic.  Right Ear: External ear normal.  Left Ear: External ear normal.  Nose: Nose normal.  Mouth/Throat: Oropharynx is clear and moist. No oropharyngeal exudate.  Eyes: Conjunctivae are normal. Pupils are equal, round, and reactive to light. Right eye exhibits no discharge. Left eye exhibits no discharge. No scleral icterus.  Neck: Normal range of motion. Neck supple. No tracheal deviation present. No thyromegaly present.  Cardiovascular: Normal rate, regular rhythm, normal heart sounds and intact distal pulses.  Exam reveals no gallop and no friction  rub.   No murmur heard. Pulmonary/Chest: Effort normal and breath sounds normal. No respiratory distress. She has no wheezes. She has no rales. She exhibits no tenderness.  Musculoskeletal: Normal range of motion. She exhibits no edema and no tenderness.  Lymphadenopathy:    She has no cervical adenopathy.  Neurological: She is alert and oriented to person, place, and time. No  cranial nerve deficit. She exhibits normal muscle tone. Coordination normal.  Skin: Skin is warm and dry. No rash noted. She is not diaphoretic. No erythema. No pallor.  Psychiatric: She has a normal mood and affect. Her behavior is normal. Judgment and thought content normal.          Assessment & Plan:

## 2012-02-29 NOTE — Progress Notes (Signed)
  Subjective:    Patient ID: Anna [name], female    DOB: 31-Aug-1921, 78 y.o.   MRN: [id number]  HPI Nurse only   Review of Systems     Objective:   Physical Exam        Assessment & Plan:

## 2012-03-03 ENCOUNTER — Other Ambulatory Visit (INDEPENDENT_AMBULATORY_CARE_PROVIDER_SITE_OTHER): Payer: Medicare Other | Admitting: *Deleted

## 2012-03-03 DIAGNOSIS — D509 Iron deficiency anemia, unspecified: Secondary | ICD-10-CM

## 2012-03-03 LAB — CBC WITH DIFFERENTIAL/PLATELET
Basophils Absolute: 0 10*3/uL (ref 0.0–0.1)
Eosinophils Relative: 1.2 % (ref 0.0–5.0)
Monocytes Relative: 7.2 % (ref 3.0–12.0)
Neutrophils Relative %: 69.6 % (ref 43.0–77.0)
Platelets: 248 10*3/uL (ref 150.0–400.0)
RDW: 16.3 % — ABNORMAL HIGH (ref 11.5–14.6)
WBC: 5.8 10*3/uL (ref 4.5–10.5)

## 2012-03-03 LAB — FERRITIN: Ferritin: 9.6 ng/mL — ABNORMAL LOW (ref 10.0–291.0)

## 2012-03-09 ENCOUNTER — Ambulatory Visit (INDEPENDENT_AMBULATORY_CARE_PROVIDER_SITE_OTHER): Payer: Medicare Other | Admitting: Internal Medicine

## 2012-03-09 ENCOUNTER — Encounter: Payer: Self-pay | Admitting: Internal Medicine

## 2012-03-09 VITALS — BP 140/80 | HR 67 | Temp 98.4°F | Ht 59.0 in | Wt 147.5 lb

## 2012-03-09 DIAGNOSIS — D509 Iron deficiency anemia, unspecified: Secondary | ICD-10-CM

## 2012-03-09 DIAGNOSIS — R42 Dizziness and giddiness: Secondary | ICD-10-CM

## 2012-03-09 DIAGNOSIS — I10 Essential (primary) hypertension: Secondary | ICD-10-CM

## 2012-03-09 DIAGNOSIS — R197 Diarrhea, unspecified: Secondary | ICD-10-CM

## 2012-03-09 DIAGNOSIS — E559 Vitamin D deficiency, unspecified: Secondary | ICD-10-CM | POA: Insufficient documentation

## 2012-03-09 LAB — COMPREHENSIVE METABOLIC PANEL
ALT: 12 U/L (ref 0–35)
AST: 20 U/L (ref 0–37)
CO2: 28 mEq/L (ref 19–32)
Calcium: 8.8 mg/dL (ref 8.4–10.5)
Chloride: 107 mEq/L (ref 96–112)
Creatinine, Ser: 1.1 mg/dL (ref 0.4–1.2)
GFR: 49.77 mL/min — ABNORMAL LOW (ref >60.00)
Sodium: 141 mEq/L (ref 135–145)
Total Bilirubin: 0.6 mg/dL (ref 0.3–1.2)
Total Protein: 6.6 g/dL (ref 6.0–8.3)

## 2012-03-09 NOTE — Assessment & Plan Note (Signed)
Patient with history of iron deficiency based on low ferritin. Hemoglobin has been stable. Encouraged her to bring stool sample for occult blood testing.

## 2012-03-09 NOTE — Assessment & Plan Note (Signed)
Patient reports single episode of lightheadedness which occurred while she was driving. Symptoms are most consistent with hypoglycemia or dehydration. Symptoms have not recurred. However, given history of hypertension hyperlipidemia, will check carotid Dopplers for further evaluation.

## 2012-03-09 NOTE — Assessment & Plan Note (Signed)
Patient with chronic diarrhea. Symptoms most consistent with irritable bowel syndrome. Will obtain records on last colonoscopy. Encouraged patient to return with a stool sample so we can send stool for occult blood testing. Will check electrolytes with labs today. Followup one month.

## 2012-03-09 NOTE — Assessment & Plan Note (Signed)
Patient with history of vitamin D deficiency on vitamin D supplements. Question if excessive intake of vitamin D may be contributing to diarrhea. Will check vitamin D level with labs.

## 2012-03-09 NOTE — Assessment & Plan Note (Signed)
Blood pressure is fairly well-controlled on metoprolol. Will continue. Will check renal function with labs today.

## 2012-03-09 NOTE — Progress Notes (Signed)
Subjective:    Patient ID: Anna [name], female    DOB: 29-Jan-1922, 78 y.o.   MRN: [id number]  HPI 78 year old female with history of hypertension, chronic diarrhea, and iron deficiency presents for followup. She reports she is generally feeling well. She does note some increased anxiety related to dealing with her family members. She reports she frequently is worrying about members of her family and sometimes has difficulty sleeping because of this. However, in general she is feeling well. She notes chronic history of watery diarrhea which is been ongoing for years. Certain foods such as salads seem to trigger this for her. She typically avoids eating prior to leaving her home to avoid symptoms of diarrhea. She denies any abdominal pain, fever, chills. She denies blood in her stool or black stool. She reports that last colonoscopy was normal.  She does note a recent episode when she was driving and became lightheaded. She pulled over to the side of the road and symptoms of lightheadedness resolved. She questions whether this may of been because of dehydration and she did not eat before leaving her home. She also questions whether anxiety may play a role. She denies any focal neurologic symptoms. She denies any chest pain, shortness of breath. This episode occurred approximately one month ago and has not recurred since that time.  Outpatient Encounter Prescriptions as of 03/09/2012  Medication Sig Dispense Refill  . aspirin EC 81 MG tablet Take 81 mg by mouth daily.        . Calcium Carbonate-Vitamin D (CALCIUM + D PO) Take by mouth daily.        . citalopram (CELEXA) 20 MG tablet Take 1 tablet (20 mg total) by mouth daily.  30 tablet  3  . ferrous fumarate-iron polysaccharide complex (TANDEM) 162-115.2 MG CAPS Take 1 capsule by mouth daily with breakfast.        . metoprolol succinate (TOPROL-XL) 25 MG 24 hr tablet Take 1 tablet (25 mg total) by mouth daily.  30 tablet  6  . Multiple  Vitamins-Minerals (MULTIVITAMIN WITH MINERALS) tablet Take 1 tablet by mouth daily.        . pantoprazole (PROTONIX) 40 MG tablet Take 1 tablet (40 mg total) by mouth daily.  30 tablet  11  . Vitamin D, Ergocalciferol, (DRISDOL) 50000 UNITS CAPS Take 50,000 Units by mouth every 7 (seven) days.       BP 140/80  Pulse 67  Temp 98.4 F (36.9 C) (Oral)  Ht 4\' 11"  (1.499 m)  Wt 147 lb 8 oz (66.906 kg)  BMI 29.79 kg/m2  SpO2 95%  Review of Systems  Constitutional: Negative for fever, chills, appetite change, fatigue and unexpected weight change.  HENT: Negative for ear pain, congestion, sore throat, trouble swallowing, neck pain, voice change and sinus pressure.   Eyes: Negative for visual disturbance.  Respiratory: Negative for cough, shortness of breath, wheezing and stridor.   Cardiovascular: Negative for chest pain, palpitations and leg swelling.  Gastrointestinal: Positive for diarrhea. Negative for nausea, vomiting, abdominal pain, constipation, blood in stool, abdominal distention and anal bleeding.  Genitourinary: Negative for dysuria and flank pain.  Musculoskeletal: Negative for myalgias, arthralgias and gait problem.  Skin: Negative for color change and rash.  Neurological: Positive for light-headedness. Negative for dizziness and headaches.  Hematological: Negative for adenopathy. Does not bruise/bleed easily.  Psychiatric/Behavioral: Negative for suicidal ideas, disturbed wake/sleep cycle and dysphoric mood. The patient is nervous/anxious.        Objective:  Physical Exam  Constitutional: She is oriented to person, place, and time. She appears well-developed and well-nourished. No distress.  HENT:  Head: Normocephalic and atraumatic.  Right Ear: External ear normal.  Left Ear: External ear normal.  Nose: Nose normal.  Mouth/Throat: Oropharynx is clear and moist. No oropharyngeal exudate.  Eyes: Conjunctivae are normal. Pupils are equal, round, and reactive to light.  Right eye exhibits no discharge. Left eye exhibits no discharge. No scleral icterus.  Neck: Normal range of motion. Neck supple. No tracheal deviation present. No thyromegaly present.  Cardiovascular: Normal rate, regular rhythm, normal heart sounds and intact distal pulses.  Exam reveals no gallop and no friction rub.   No murmur heard. Pulmonary/Chest: Effort normal and breath sounds normal. No respiratory distress. She has no wheezes. She has no rales. She exhibits no tenderness.  Abdominal: Soft. Bowel sounds are normal. She exhibits no distension. There is no tenderness.  Musculoskeletal: Normal range of motion. She exhibits no edema and no tenderness.  Lymphadenopathy:    She has no cervical adenopathy.  Neurological: She is alert and oriented to person, place, and time. No cranial nerve deficit. She exhibits normal muscle tone. Coordination normal.  Skin: Skin is warm and dry. No rash noted. She is not diaphoretic. No erythema. No pallor.  Psychiatric: Her speech is normal and behavior is normal. Judgment and thought content normal. Her mood appears anxious.          Assessment & Plan:

## 2012-03-10 ENCOUNTER — Ambulatory Visit: Payer: Self-pay | Admitting: Internal Medicine

## 2012-03-11 ENCOUNTER — Telehealth: Payer: Self-pay | Admitting: Internal Medicine

## 2012-03-11 NOTE — Telephone Encounter (Signed)
Left message on home and cell asking patient to call back

## 2012-03-11 NOTE — Telephone Encounter (Signed)
[place] of carotids showed minimal plaque, but no significant narrowing

## 2012-03-15 NOTE — Telephone Encounter (Signed)
Left message on machine at home and on cell phone voicemail [phone] for patient and/or family member to return call.

## 2012-03-15 NOTE — Telephone Encounter (Signed)
Patient advised as instructed via telephone. 

## 2012-03-22 ENCOUNTER — Encounter: Payer: Self-pay | Admitting: Internal Medicine

## 2012-04-13 ENCOUNTER — Encounter: Payer: Self-pay | Admitting: Internal Medicine

## 2012-04-13 ENCOUNTER — Ambulatory Visit (INDEPENDENT_AMBULATORY_CARE_PROVIDER_SITE_OTHER): Payer: Medicare Other | Admitting: Internal Medicine

## 2012-04-13 VITALS — BP 130/80 | HR 64 | Temp 98.0°F | Ht 59.0 in | Wt 149.5 lb

## 2012-04-13 DIAGNOSIS — R2681 Unsteadiness on feet: Secondary | ICD-10-CM | POA: Insufficient documentation

## 2012-04-13 DIAGNOSIS — E559 Vitamin D deficiency, unspecified: Secondary | ICD-10-CM

## 2012-04-13 DIAGNOSIS — Z23 Encounter for immunization: Secondary | ICD-10-CM

## 2012-04-13 DIAGNOSIS — I10 Essential (primary) hypertension: Secondary | ICD-10-CM

## 2012-04-13 DIAGNOSIS — R269 Unspecified abnormalities of gait and mobility: Secondary | ICD-10-CM

## 2012-04-13 DIAGNOSIS — R42 Dizziness and giddiness: Secondary | ICD-10-CM

## 2012-04-13 MED ORDER — METOPROLOL SUCCINATE ER 25 MG PO TB24: 25.0000 mg | ORAL_TABLET | Freq: Every day | ORAL | Status: DC

## 2012-04-13 NOTE — Assessment & Plan Note (Signed)
Symptoms have resolved. Recent labs and carotid dopplers were normal. Will continue to monitor.

## 2012-04-13 NOTE — Progress Notes (Signed)
Subjective:    Patient ID: Anna [name], female    DOB: 1922/05/17, 78 y.o.   MRN: [id number]  HPI 78 year old female with history of hypertension and hyperlipidemia presents for followup. At her last visit, she complained of lightheadedness. She had lab work performed including CBC, CMP, B12, TSH which were normal. She also had carotid Dopplers which showed plaque but no significant stenosis. She reports that symptoms of lightheadedness have resolved. She reports she is generally been feeling well, however she is concerned today about several month history of gait instability. She notes that approximately 2 weeks ago she tripped and had a fall in which she landed on her right knee developing a hematoma. She did not feel lightheaded prior to this event. She reports that she tripped on a rug. In general, she feels that her strength is normal. She does not use a cane or Calem Cocozza. She notes that occasionally, she gets in a hurry and is not paying attention to her footing. On a couple of occasions she has lost her balance.  Outpatient Encounter Prescriptions as of 04/13/2012  Medication Sig Dispense Refill  . aspirin EC 81 MG tablet Take 81 mg by mouth daily.        . Calcium Carbonate-Vitamin D (CALCIUM + D PO) Take by mouth daily.        . citalopram (CELEXA) 20 MG tablet Take 1 tablet (20 mg total) by mouth daily.  30 tablet  3  . ferrous fumarate-iron polysaccharide complex (TANDEM) 162-115.2 MG CAPS Take 1 capsule by mouth daily with breakfast.        . metoprolol succinate (TOPROL-XL) 25 MG 24 hr tablet Take 1 tablet (25 mg total) by mouth daily.  30 tablet  6  . Multiple Vitamins-Minerals (MULTIVITAMIN WITH MINERALS) tablet Take 1 tablet by mouth daily.        . pantoprazole (PROTONIX) 40 MG tablet Take 1 tablet (40 mg total) by mouth daily.  30 tablet  11  . DISCONTD: metoprolol succinate (TOPROL-XL) 25 MG 24 hr tablet Take 1 tablet (25 mg total) by mouth daily.  30 tablet  6  . DISCONTD: Vitamin  D, Ergocalciferol, (DRISDOL) 50000 UNITS CAPS Take 50,000 Units by mouth every 7 (seven) days.       BP 130/80  Pulse 64  Temp 98 F (36.7 C) (Oral)  Ht 4\' 11"  (1.499 m)  Wt 149 lb 8 oz (67.813 kg)  BMI 30.20 kg/m2  SpO2 97%  Review of Systems  Constitutional: Negative for fever, chills, appetite change, fatigue and unexpected weight change.  HENT: Negative for ear pain, congestion, sore throat, trouble swallowing, neck pain, voice change and sinus pressure.   Eyes: Negative for visual disturbance.  Respiratory: Negative for cough, shortness of breath, wheezing and stridor.   Cardiovascular: Negative for chest pain, palpitations and leg swelling.  Gastrointestinal: Negative for nausea, vomiting, abdominal pain, diarrhea, constipation, blood in stool, abdominal distention and anal bleeding.  Genitourinary: Negative for dysuria and flank pain.  Musculoskeletal: Positive for gait problem. Negative for myalgias and arthralgias.  Skin: Negative for color change and rash.  Neurological: Negative for dizziness and headaches.  Hematological: Negative for adenopathy. Does not bruise/bleed easily.  Psychiatric/Behavioral: Negative for suicidal ideas, disturbed wake/sleep cycle and dysphoric mood. The patient is not nervous/anxious.        Objective:   Physical Exam  Constitutional: She is oriented to person, place, and time. She appears well-developed and well-nourished. No distress.  HENT:  Head: Normocephalic  and atraumatic.  Right Ear: External ear normal.  Left Ear: External ear normal.  Nose: Nose normal.  Mouth/Throat: Oropharynx is clear and moist. No oropharyngeal exudate.  Eyes: Conjunctivae normal are normal. Pupils are equal, round, and reactive to light. Right eye exhibits no discharge. Left eye exhibits no discharge. No scleral icterus.  Neck: Normal range of motion. Neck supple. No tracheal deviation present. No thyromegaly present.  Cardiovascular: Normal rate, regular  rhythm, normal heart sounds and intact distal pulses.  Exam reveals no gallop and no friction rub.   No murmur heard. Pulmonary/Chest: Effort normal and breath sounds normal. No respiratory distress. She has no wheezes. She has no rales. She exhibits no tenderness.  Musculoskeletal: Normal range of motion. She exhibits no edema and no tenderness.  Lymphadenopathy:    She has no cervical adenopathy.  Neurological: She is alert and oriented to person, place, and time. No cranial nerve deficit. She exhibits normal muscle tone. Coordination normal.  Skin: Skin is warm and dry. No rash noted. She is not diaphoretic. No erythema. No pallor.  Psychiatric: She has a normal mood and affect. Her behavior is normal. Judgment and thought content normal.          Assessment & Plan:

## 2012-04-13 NOTE — Assessment & Plan Note (Signed)
Pt reports some gait instability and recent fall. Exam including strength and balance is normal today. Discussed referral for balance and strength training with PT, however pt would like to hold off for now. Also discussed potential use of cane or walker, however pt would like to hold off on this. Follow up 3 months and prn.

## 2012-04-13 NOTE — Assessment & Plan Note (Signed)
Pt noted to have mildly low Vit D on labs. Discussed using Vit D 2000units daily. Also discussed new FDA recommendation against use of calcium supplementation. Will plan to recheck Vit D in 3 months, 07/2012.

## 2012-04-13 NOTE — Patient Instructions (Signed)
Fine to stop calcium supplementation. Start Vitamin D 2000units daily

## 2012-07-20 ENCOUNTER — Ambulatory Visit: Payer: Medicare Other | Admitting: Internal Medicine

## 2012-08-11 ENCOUNTER — Ambulatory Visit: Payer: Medicare Other | Admitting: Internal Medicine

## 2012-08-23 ENCOUNTER — Encounter: Payer: Self-pay | Admitting: Internal Medicine

## 2012-08-23 ENCOUNTER — Ambulatory Visit (INDEPENDENT_AMBULATORY_CARE_PROVIDER_SITE_OTHER): Payer: Medicare Other | Admitting: Internal Medicine

## 2012-08-23 VITALS — BP 140/78 | HR 72 | Temp 97.6°F | Ht 59.0 in | Wt 144.8 lb

## 2012-08-23 DIAGNOSIS — F411 Generalized anxiety disorder: Secondary | ICD-10-CM

## 2012-08-23 DIAGNOSIS — F419 Anxiety disorder, unspecified: Secondary | ICD-10-CM

## 2012-08-23 DIAGNOSIS — I10 Essential (primary) hypertension: Secondary | ICD-10-CM

## 2012-08-23 DIAGNOSIS — D509 Iron deficiency anemia, unspecified: Secondary | ICD-10-CM

## 2012-08-23 LAB — CBC WITH DIFFERENTIAL/PLATELET
Basophils Relative: 0.7 % (ref 0.0–3.0)
Eosinophils Absolute: 0.1 10*3/uL (ref 0.0–0.7)
Eosinophils Relative: 2.7 % (ref 0.0–5.0)
Hemoglobin: 13 g/dL (ref 12.0–15.0)
Lymphocytes Relative: 26.9 % (ref 12.0–46.0)
MCHC: 32.8 g/dL (ref 30.0–36.0)
MCV: 75.6 fl — ABNORMAL LOW (ref 78.0–100.0)
Monocytes Absolute: 0.2 10*3/uL (ref 0.1–1.0)
Neutro Abs: 2.6 10*3/uL (ref 1.4–7.7)
RBC: 5.25 Mil/uL — ABNORMAL HIGH (ref 3.87–5.11)

## 2012-08-23 LAB — COMPREHENSIVE METABOLIC PANEL
AST: 22 U/L (ref 0–37)
Albumin: 3.8 g/dL (ref 3.5–5.2)
BUN: 15 mg/dL (ref 6–23)
Calcium: 9 mg/dL (ref 8.4–10.5)
Chloride: 107 mEq/L (ref 96–112)
Creatinine, Ser: 1.1 mg/dL (ref 0.4–1.2)
Glucose, Bld: 109 mg/dL — ABNORMAL HIGH (ref 70–99)
Potassium: 4.6 mEq/L (ref 3.5–5.1)

## 2012-08-23 MED ORDER — CITALOPRAM HYDROBROMIDE 20 MG PO TABS: 20.0000 mg | ORAL_TABLET | Freq: Every day | ORAL | Status: DC

## 2012-08-23 NOTE — Assessment & Plan Note (Signed)
BP Readings from Last 3 Encounters:  08/23/12 140/78  04/13/12 130/80  03/09/12 140/80   BP well controlled on current medications. Will continue. Will check renal function with labs today.

## 2012-08-23 NOTE — Progress Notes (Signed)
Subjective:    Patient ID: Anna [name], female    DOB: 1921-08-25, 79 y.o.   MRN: [id number]  HPI 79YO female with HTN, DM, HL, and anxiety presents for follow up.   Anxiety - notes increase in symptoms of constant worry and difficulty sleeping since stopping Celexa. Would like to start back on this medication. No side effects noted in the past.  HTN - Reports compliance with meds. No recent chest pain, headache, palpitations.  Outpatient Encounter Prescriptions as of 08/23/2012  Medication Sig Dispense Refill  . aspirin EC 81 MG tablet Take 81 mg by mouth daily.        . Calcium Carbonate-Vitamin D (CALCIUM + D PO) Take by mouth daily.        . ferrous fumarate-iron polysaccharide complex (TANDEM) 162-115.2 MG CAPS Take 1 capsule by mouth daily with breakfast.        . metoprolol succinate (TOPROL-XL) 25 MG 24 hr tablet Take 1 tablet (25 mg total) by mouth daily.  30 tablet  6  . Multiple Vitamins-Minerals (MULTIVITAMIN WITH MINERALS) tablet Take 1 tablet by mouth daily.        . pantoprazole (PROTONIX) 40 MG tablet Take 1 tablet (40 mg total) by mouth daily.  30 tablet  11  . citalopram (CELEXA) 20 MG tablet Take 1 tablet (20 mg total) by mouth daily.  30 tablet  3  . [DISCONTINUED] citalopram (CELEXA) 20 MG tablet Take 1 tablet (20 mg total) by mouth daily.  30 tablet  3   No facility-administered encounter medications on file as of 08/23/2012.   BP 140/78  Pulse 72  Temp(Src) 97.6 F (36.4 C) (Oral)  Ht 4\' 11"  (1.499 m)  Wt 144 lb 12 oz (65.658 kg)  BMI 29.22 kg/m2  SpO2 97%  Review of Systems  Constitutional: Negative for fever, chills, appetite change, fatigue and unexpected weight change.  HENT: Negative for ear pain, congestion, sore throat, trouble swallowing, neck pain, voice change and sinus pressure.   Eyes: Negative for visual disturbance.  Respiratory: Negative for cough, shortness of breath, wheezing and stridor.   Cardiovascular: Negative for chest pain,  palpitations and leg swelling.  Gastrointestinal: Negative for nausea, vomiting, abdominal pain, diarrhea, constipation, blood in stool, abdominal distention and anal bleeding.  Genitourinary: Negative for dysuria and flank pain.  Musculoskeletal: Negative for myalgias, arthralgias and gait problem.  Skin: Negative for color change and rash.  Neurological: Negative for dizziness and headaches.  Hematological: Negative for adenopathy. Does not bruise/bleed easily.  Psychiatric/Behavioral: Negative for suicidal ideas, sleep disturbance and dysphoric mood. The patient is nervous/anxious.        Objective:   Physical Exam  Constitutional: She is oriented to person, place, and time. She appears well-developed and well-nourished. No distress.  HENT:  Head: Normocephalic and atraumatic.  Right Ear: External ear normal.  Left Ear: External ear normal.  Nose: Nose normal.  Mouth/Throat: Oropharynx is clear and moist. No oropharyngeal exudate.  Eyes: Conjunctivae are normal. Pupils are equal, round, and reactive to light. Right eye exhibits no discharge. Left eye exhibits no discharge. No scleral icterus.  Neck: Normal range of motion. Neck supple. No tracheal deviation present. No thyromegaly present.  Cardiovascular: Normal rate, regular rhythm, normal heart sounds and intact distal pulses.  Exam reveals no gallop and no friction rub.   No murmur heard. Pulmonary/Chest: Effort normal and breath sounds normal. No respiratory distress. She has no wheezes. She has no rales. She exhibits no tenderness.  Musculoskeletal: Normal range of motion. She exhibits no edema and no tenderness.  Lymphadenopathy:    She has no cervical adenopathy.  Neurological: She is alert and oriented to person, place, and time. No cranial nerve deficit. She exhibits normal muscle tone. Coordination normal.  Skin: Skin is warm and dry. No rash noted. She is not diaphoretic. No erythema. No pallor.  Psychiatric: Her speech  is normal and behavior is normal. Judgment and thought content normal. Her mood appears anxious.          Assessment & Plan:

## 2012-08-23 NOTE — Assessment & Plan Note (Signed)
Symptoms of anxiety worsened off Celexa. Will restart medication. Follow up 1 month and prn.

## 2012-08-23 NOTE — Assessment & Plan Note (Signed)
CBC shows Hgb stable, however ferritin continues to be low. Will check stool hemocult.

## 2012-08-30 ENCOUNTER — Telehealth: Payer: Self-pay | Admitting: *Deleted

## 2012-08-30 NOTE — Telephone Encounter (Signed)
Patient informed and verbally agreed. Mailed out list of foods high in iron to patient home address on file.

## 2012-08-30 NOTE — Telephone Encounter (Signed)
She could try increasing intake of iron rich foods, such as lean meats. We could also start an OTC iron supplement, such as ferrous sulfate 324mg  po bid. We can discuss more at her visit as well.

## 2012-08-30 NOTE — Telephone Encounter (Signed)
Spoke with patient about lab results, she stated it has been a long time since she has had a colonoscopy in many years. Not real sure when her last one was, think it was almost 20 years ago and she has only had one. Also would you have any recommendations for her to try for her iron levels at this time? She has denied doing the home stool kit at this time, she would like to discuss further at her OV.

## 2012-09-23 ENCOUNTER — Ambulatory Visit: Payer: Medicare Other | Admitting: Internal Medicine

## 2012-12-14 ENCOUNTER — Ambulatory Visit (INDEPENDENT_AMBULATORY_CARE_PROVIDER_SITE_OTHER): Payer: Medicare Other | Admitting: Internal Medicine

## 2012-12-14 ENCOUNTER — Encounter: Payer: Self-pay | Admitting: Internal Medicine

## 2012-12-14 VITALS — BP 142/80 | HR 71 | Temp 97.8°F | Ht 59.0 in | Wt 145.0 lb

## 2012-12-14 DIAGNOSIS — D509 Iron deficiency anemia, unspecified: Secondary | ICD-10-CM

## 2012-12-14 DIAGNOSIS — R269 Unspecified abnormalities of gait and mobility: Secondary | ICD-10-CM

## 2012-12-14 DIAGNOSIS — E785 Hyperlipidemia, unspecified: Secondary | ICD-10-CM

## 2012-12-14 DIAGNOSIS — Z733 Stress, not elsewhere classified: Secondary | ICD-10-CM

## 2012-12-14 DIAGNOSIS — N390 Urinary tract infection, site not specified: Secondary | ICD-10-CM

## 2012-12-14 DIAGNOSIS — Z634 Disappearance and death of family member: Secondary | ICD-10-CM

## 2012-12-14 DIAGNOSIS — F4321 Adjustment disorder with depressed mood: Secondary | ICD-10-CM

## 2012-12-14 DIAGNOSIS — I10 Essential (primary) hypertension: Secondary | ICD-10-CM

## 2012-12-14 LAB — COMPREHENSIVE METABOLIC PANEL
AST: 19 U/L (ref 0–37)
Alkaline Phosphatase: 75 U/L (ref 39–117)
BUN: 21 mg/dL (ref 6–23)
Creatinine, Ser: 1 mg/dL (ref 0.4–1.2)
Glucose, Bld: 98 mg/dL (ref 70–99)
Potassium: 4 mEq/L (ref 3.5–5.1)
Total Bilirubin: 0.9 mg/dL (ref 0.3–1.2)

## 2012-12-14 LAB — LIPID PANEL
Cholesterol: 159 mg/dL (ref 0–200)
HDL: 31.4 mg/dL — ABNORMAL LOW (ref >39.00)
LDL Cholesterol: 111 mg/dL — ABNORMAL HIGH (ref 0–99)
Total CHOL/HDL Ratio: 5
Triglycerides: 84 mg/dL (ref 0.0–149.0)
VLDL: 16.8 mg/dL (ref 0.0–40.0)

## 2012-12-14 LAB — POCT URINALYSIS DIPSTICK
Nitrite, UA: NEGATIVE
Spec Grav, UA: 1.025
Urobilinogen, UA: 1
pH, UA: 6

## 2012-12-14 LAB — CBC WITH DIFFERENTIAL/PLATELET
Basophils Relative: 0.5 % (ref 0.0–3.0)
Eosinophils Absolute: 0.1 10*3/uL (ref 0.0–0.7)
HCT: 41.5 % (ref 36.0–46.0)
Lymphs Abs: 1.2 10*3/uL (ref 0.7–4.0)
MCHC: 32.4 g/dL (ref 30.0–36.0)
MCV: 76.3 fl — ABNORMAL LOW (ref 78.0–100.0)
Monocytes Absolute: 0.3 10*3/uL (ref 0.1–1.0)
Neutrophils Relative %: 63.9 % (ref 43.0–77.0)
RBC: 5.43 Mil/uL — ABNORMAL HIGH (ref 3.87–5.11)

## 2012-12-14 NOTE — Addendum Note (Signed)
Addended by: Theola Sequin on: 12/14/2012 02:59 PM   Modules accepted: Orders

## 2012-12-14 NOTE — Assessment & Plan Note (Signed)
Will repeat CBC with labs today. 

## 2012-12-14 NOTE — Assessment & Plan Note (Signed)
Unsteady gait with h/o recent falls. Recommended PT for balance training and strength training. Pt declines.

## 2012-12-14 NOTE — Progress Notes (Signed)
Subjective:    Patient ID: Anna [name], female    DOB: March 31, 1922, 79 y.o.   MRN: [id number]  HPI 79YO female with h/o HTN, and iron deficiency anemia presents for follow up. Tearful today. Her brother passed away last night. This was expected, however still difficult for her.  She has support through family and hospice.  Notes recent fall at [facility] from standing position when her foot was caught on carpet. No noted injury from this. Reports chronic problems with gait and stability. Especially difficulty when getting up from seated position.   Outpatient Encounter Prescriptions as of 12/14/2012  Medication Sig Dispense Refill  . aspirin EC 81 MG tablet Take 81 mg by mouth daily.        . Calcium Carbonate-Vitamin D (CALCIUM + D PO) Take by mouth daily.        . citalopram (CELEXA) 20 MG tablet Take 1 tablet (20 mg total) by mouth daily.  30 tablet  3  . ferrous fumarate-iron polysaccharide complex (TANDEM) 162-115.2 MG CAPS Take 1 capsule by mouth daily with breakfast.        . metoprolol succinate (TOPROL-XL) 25 MG 24 hr tablet Take 1 tablet (25 mg total) by mouth daily.  30 tablet  6  . Multiple Vitamins-Minerals (MULTIVITAMIN WITH MINERALS) tablet Take 1 tablet by mouth daily.        . pantoprazole (PROTONIX) 40 MG tablet Take 1 tablet (40 mg total) by mouth daily.  30 tablet  11   No facility-administered encounter medications on file as of 12/14/2012.   BP 142/80  Pulse 71  Temp(Src) 97.8 F (36.6 C) (Oral)  Ht 4\' 11"  (1.499 m)  Wt 145 lb (65.772 kg)  BMI 29.27 kg/m2  SpO2 95%  Review of Systems  Constitutional: Negative for fever, chills, appetite change, fatigue and unexpected weight change.  HENT: Negative for ear pain, congestion, sore throat, trouble swallowing, neck pain, voice change and sinus pressure.   Eyes: Negative for visual disturbance.  Respiratory: Negative for cough, shortness of breath, wheezing and stridor.   Cardiovascular: Negative for chest pain,  palpitations and leg swelling.  Gastrointestinal: Negative for nausea, vomiting, abdominal pain, diarrhea, constipation, blood in stool, abdominal distention and anal bleeding.  Genitourinary: Negative for dysuria and flank pain.  Musculoskeletal: Positive for gait problem. Negative for myalgias and arthralgias.  Skin: Negative for color change and rash.  Neurological: Negative for dizziness and headaches.  Hematological: Negative for adenopathy. Does not bruise/bleed easily.  Psychiatric/Behavioral: Positive for sleep disturbance and dysphoric mood. Negative for suicidal ideas. The patient is not nervous/anxious.        Objective:   Physical Exam  Constitutional: She is oriented to person, place, and time. She appears well-developed and well-nourished. No distress.  HENT:  Head: Normocephalic and atraumatic.  Right Ear: External ear normal.  Left Ear: External ear normal.  Nose: Nose normal.  Mouth/Throat: Oropharynx is clear and moist. No oropharyngeal exudate.  Eyes: Conjunctivae are normal. Pupils are equal, round, and reactive to light. Right eye exhibits no discharge. Left eye exhibits no discharge. No scleral icterus.  Neck: Normal range of motion. Neck supple. No tracheal deviation present. No thyromegaly present.  Cardiovascular: Normal rate, regular rhythm, normal heart sounds and intact distal pulses.  Exam reveals no gallop and no friction rub.   No murmur heard. Pulmonary/Chest: Effort normal and breath sounds normal. No accessory muscle usage. Not tachypneic. No respiratory distress. She has no decreased breath sounds. She has no  wheezes. She has no rhonchi. She has no rales. She exhibits no tenderness.  Musculoskeletal: Normal range of motion. She exhibits no edema and no tenderness.  Lymphadenopathy:    She has no cervical adenopathy.  Neurological: She is alert and oriented to person, place, and time. No cranial nerve deficit or sensory deficit. She exhibits normal muscle  tone. Gait (gait unsteady, requires frequent stabilization) abnormal. Coordination normal.  Skin: Skin is warm and dry. No rash noted. She is not diaphoretic. No erythema. No pallor.  Psychiatric: Her speech is normal and behavior is normal. Judgment and thought content normal. Cognition and memory are normal. She exhibits a depressed mood.  Tearful today, described death of her brother last night.          Assessment & Plan:

## 2012-12-14 NOTE — Assessment & Plan Note (Signed)
Will check lipids with labs today.  

## 2012-12-14 NOTE — Assessment & Plan Note (Signed)
Offered support today. Pt has services set up through [facility] for her brother.

## 2012-12-14 NOTE — Assessment & Plan Note (Signed)
BP Readings from Last 3 Encounters:  12/14/12 142/80  08/23/12 140/78  04/13/12 130/80   BP well controlled on current medications. Will continue.

## 2012-12-17 LAB — URINE CULTURE: Colony Count: >=100000

## 2012-12-19 ENCOUNTER — Telehealth: Payer: Self-pay | Admitting: *Deleted

## 2012-12-19 DIAGNOSIS — R2681 Unsteadiness on feet: Secondary | ICD-10-CM

## 2012-12-19 MED ORDER — SULFAMETHOXAZOLE-TRIMETHOPRIM 800-160 MG PO TABS: 1.0000 | ORAL_TABLET | Freq: Two times a day (BID) | ORAL | Status: DC

## 2012-12-19 NOTE — Telephone Encounter (Signed)
Rx sent and patient aware 

## 2012-12-19 NOTE — Telephone Encounter (Signed)
Patient called and stated she talked it over with her daughter, she would like for you to go ahead with referral to physical therapy for trouble with her gait.

## 2012-12-19 NOTE — Telephone Encounter (Signed)
Message copied by Theola Sequin on Mon Dec 19, 2012  9:55 AM ------      Message from: Ronna Polio A      Created: Fri Dec 16, 2012  8:02 AM       Urine culture came back showing UTI. I would like to start Cipro 250mg  po bid x 7 days. Please confirm no allergy ------

## 2013-01-06 ENCOUNTER — Other Ambulatory Visit: Payer: Self-pay | Admitting: *Deleted

## 2013-01-06 DIAGNOSIS — K219 Gastro-esophageal reflux disease without esophagitis: Secondary | ICD-10-CM

## 2013-01-06 MED ORDER — PANTOPRAZOLE SODIUM 40 MG PO TBEC: 40.0000 mg | DELAYED_RELEASE_TABLET | Freq: Every day | ORAL | Status: AC

## 2013-01-06 NOTE — Telephone Encounter (Signed)
Eprescribed.

## 2013-01-09 ENCOUNTER — Encounter: Payer: Self-pay | Admitting: Adult Health

## 2013-01-09 ENCOUNTER — Ambulatory Visit (INDEPENDENT_AMBULATORY_CARE_PROVIDER_SITE_OTHER): Payer: Medicare Other | Admitting: Adult Health

## 2013-01-09 VITALS — BP 142/60 | HR 61 | Temp 97.9°F | Resp 12 | Wt 147.0 lb

## 2013-01-09 DIAGNOSIS — B379 Candidiasis, unspecified: Secondary | ICD-10-CM | POA: Insufficient documentation

## 2013-01-09 DIAGNOSIS — R300 Dysuria: Secondary | ICD-10-CM | POA: Insufficient documentation

## 2013-01-09 LAB — POCT URINALYSIS DIPSTICK
Bilirubin, UA: NEGATIVE
Glucose, UA: NEGATIVE
Ketones, UA: NEGATIVE
Nitrite, UA: NEGATIVE
Spec Grav, UA: >=1.03
pH, UA: 6

## 2013-01-09 MED ORDER — FLUCONAZOLE 150 MG PO TABS: ORAL_TABLET | ORAL | Status: DC

## 2013-01-09 MED ORDER — NYSTATIN-TRIAMCINOLONE 100000-0.1 UNIT/GM-% EX OINT: TOPICAL_OINTMENT | Freq: Two times a day (BID) | CUTANEOUS | Status: DC

## 2013-01-09 NOTE — Progress Notes (Signed)
  Subjective:    Patient ID: Anna [name], female    DOB: December 13, 1921, 79 y.o.   MRN: [id number]  HPI  Is a pleasant 79 year old female who recently had dental procedure and was started on amoxicillin. Secondary to this antibiotic, patient has developed a yeast infection. She has tried over-the-counter miconazole which she reports has improved symptoms somewhat but they are not fully resolved. Patient is complaining of dysuria and external burning. She denies fever or chills. She is just very uncomfortable.   Current Outpatient Prescriptions on File Prior to Visit  Medication Sig Dispense Refill  . aspirin EC 81 MG tablet Take 81 mg by mouth daily.        . Calcium Carbonate-Vitamin D (CALCIUM + D PO) Take by mouth daily.        . citalopram (CELEXA) 20 MG tablet Take 1 tablet (20 mg total) by mouth daily.  30 tablet  3  . ferrous fumarate-iron polysaccharide complex (TANDEM) 162-115.2 MG CAPS Take 1 capsule by mouth daily with breakfast.        . metoprolol succinate (TOPROL-XL) 25 MG 24 hr tablet Take 1 tablet (25 mg total) by mouth daily.  30 tablet  6  . Multiple Vitamins-Minerals (MULTIVITAMIN WITH MINERALS) tablet Take 1 tablet by mouth daily.        . pantoprazole (PROTONIX) 40 MG tablet Take 1 tablet (40 mg total) by mouth daily.  30 tablet  6   No current facility-administered medications on file prior to visit.     Review of Systems  Constitutional: Negative.   Respiratory: Negative.   Cardiovascular: Negative.   Genitourinary: Positive for dysuria and frequency.       Irritation, itching of the external vaginal area    BP 142/60  Pulse 61  Temp(Src) 97.9 F (36.6 C) (Oral)  Resp 12  Wt 147 lb (66.679 kg)  BMI 29.67 kg/m2  SpO2 96%    Objective:   Physical Exam  Constitutional: She is oriented to person, place, and time. She appears well-developed and well-nourished. No distress.  Cardiovascular: Normal rate and regular rhythm.   Pulmonary/Chest: Effort normal. No  respiratory distress.  Neurological: She is alert and oriented to person, place, and time.  Psychiatric: She has a normal mood and affect. Her behavior is normal. Judgment and thought content normal.  Genitalia: no vaginal discharge.      Assessment & Plan:

## 2013-01-09 NOTE — Assessment & Plan Note (Signed)
He should experiencing mild burning with urination. Urine dipstick showing some leukocytes. Will send urine for culture. May take AZO for her urinary symptoms. Once results are available will determine if patient needs further antibiotics.

## 2013-01-09 NOTE — Patient Instructions (Addendum)
  Take one dose of Diflucan. If your symptoms are not improved after 3 days, then take the 2nd pill. If your symptoms are better you do not need to take the 2nd pill.  Apply nystatin/triamcinolone ointment to the affected area twice a day.  I am sending your urine for culture.  Once I receive the results I will contact you.  You can try an over the counter medication that helps with the sensation of burning when you urinate. It is called AZO. You only take this for 2 days.

## 2013-01-09 NOTE — Assessment & Plan Note (Signed)
Start Diflucan 150 mg x1 may repeat in 3 days if necessary. Apply nystatin/triamcinolone to affected area.

## 2013-01-12 LAB — URINE CULTURE: Colony Count: 50000

## 2013-01-16 ENCOUNTER — Telehealth: Payer: Self-pay | Admitting: *Deleted

## 2013-01-16 ENCOUNTER — Other Ambulatory Visit: Payer: Self-pay | Admitting: Adult Health

## 2013-01-16 DIAGNOSIS — A498 Other bacterial infections of unspecified site: Secondary | ICD-10-CM

## 2013-01-16 MED ORDER — CEPHALEXIN 500 MG PO CAPS: 500.0000 mg | ORAL_CAPSULE | Freq: Three times a day (TID) | ORAL | Status: DC

## 2013-01-16 NOTE — Telephone Encounter (Signed)
Patient left a message for Raquel's nurse to call her.

## 2013-01-16 NOTE — Telephone Encounter (Signed)
Discussed pt's urine culture and treatment. Pt has picked up her antibiotic and verbalized understanding.

## 2013-01-16 NOTE — Telephone Encounter (Signed)
Left message for pt to return my call.

## 2013-02-10 ENCOUNTER — Other Ambulatory Visit: Payer: Self-pay | Admitting: *Deleted

## 2013-02-10 DIAGNOSIS — I10 Essential (primary) hypertension: Secondary | ICD-10-CM

## 2013-02-10 MED ORDER — METOPROLOL SUCCINATE ER 25 MG PO TB24: 25.0000 mg | ORAL_TABLET | Freq: Every day | ORAL | Status: DC

## 2013-02-10 NOTE — Telephone Encounter (Signed)
Eprescribed.

## 2013-03-20 ENCOUNTER — Ambulatory Visit: Payer: Medicare Other | Admitting: Internal Medicine

## 2013-08-17 LAB — COMPREHENSIVE METABOLIC PANEL
ALK PHOS: 94 U/L
ALT: 14 U/L (ref 12–78)
AST: 24 U/L (ref 15–37)
Albumin: 3.2 g/dL — ABNORMAL LOW (ref 3.4–5.0)
Anion Gap: 5 — ABNORMAL LOW (ref 7–16)
BUN: 20 mg/dL — AB (ref 7–18)
Bilirubin,Total: 0.6 mg/dL (ref 0.2–1.0)
CALCIUM: 8.6 mg/dL (ref 8.5–10.1)
CREATININE: 1.17 mg/dL (ref 0.60–1.30)
Chloride: 107 mmol/L (ref 98–107)
Co2: 27 mmol/L (ref 21–32)
EGFR (African American): 48 — ABNORMAL LOW
GFR CALC NON AF AMER: 41 — AB
GLUCOSE: 125 mg/dL — AB (ref 65–99)
OSMOLALITY: 282 (ref 275–301)
Potassium: 3.9 mmol/L (ref 3.5–5.1)
SODIUM: 139 mmol/L (ref 136–145)
TOTAL PROTEIN: 6.5 g/dL (ref 6.4–8.2)

## 2013-08-17 LAB — CBC
HCT: 40 % (ref 35.0–47.0)
HGB: 13 g/dL (ref 12.0–16.0)
MCH: 24.9 pg — ABNORMAL LOW (ref 26.0–34.0)
MCHC: 32.4 g/dL (ref 32.0–36.0)
MCV: 77 fL — ABNORMAL LOW (ref 80–100)
PLATELETS: 180 10*3/uL (ref 150–440)
RBC: 5.21 10*6/uL — ABNORMAL HIGH (ref 3.80–5.20)
RDW: 16.4 % — ABNORMAL HIGH (ref 11.5–14.5)
WBC: 4.8 10*3/uL (ref 3.6–11.0)

## 2013-08-17 LAB — TROPONIN I: Troponin-I: <0.02 ng/mL

## 2013-08-18 ENCOUNTER — Inpatient Hospital Stay: Payer: Self-pay | Admitting: Family Medicine

## 2013-08-18 LAB — CK-MB
CK-MB: 2.7 ng/mL (ref 0.5–3.6)
CK-MB: 2.7 ng/mL (ref 0.5–3.6)
CK-MB: 2.5 ng/mL (ref 0.5–3.6)

## 2013-08-18 LAB — URINALYSIS, COMPLETE
Bilirubin,UR: NEGATIVE
Blood: NEGATIVE
Glucose,UR: NEGATIVE mg/dL (ref 0–75)
Ketone: NEGATIVE
Nitrite: NEGATIVE
Ph: 7 (ref 4.5–8.0)
Protein: NEGATIVE
RBC,UR: <1 /HPF (ref 0–5)
Specific Gravity: 1.006 (ref 1.003–1.030)
Squamous Epithelial: NONE SEEN
WBC UR: 3 /HPF (ref 0–5)

## 2013-08-18 LAB — LIPID PANEL
Cholesterol: 129 mg/dL (ref 0–200)
HDL: 33 mg/dL — AB (ref 40–60)
Ldl Cholesterol, Calc: 80 mg/dL (ref 0–100)
TRIGLYCERIDES: 82 mg/dL (ref 0–200)
VLDL Cholesterol, Calc: 16 mg/dL (ref 5–40)

## 2013-08-18 LAB — CBC WITH DIFFERENTIAL/PLATELET
BASOS ABS: 0 10*3/uL (ref 0.0–0.1)
Basophil %: 0.9 %
EOS ABS: 0 10*3/uL (ref 0.0–0.7)
Eosinophil %: 1.2 %
HCT: 38.2 % (ref 35.0–47.0)
HGB: 12.4 g/dL (ref 12.0–16.0)
LYMPHS PCT: 27.8 %
Lymphocyte #: 1.1 10*3/uL (ref 1.0–3.6)
MCH: 24.9 pg — ABNORMAL LOW (ref 26.0–34.0)
MCHC: 32.3 g/dL (ref 32.0–36.0)
MCV: 77 fL — ABNORMAL LOW (ref 80–100)
MONO ABS: 0.3 x10 3/mm (ref 0.2–0.9)
Monocyte %: 8.3 %
NEUTROS PCT: 61.8 %
Neutrophil #: 2.5 10*3/uL (ref 1.4–6.5)
PLATELETS: 169 10*3/uL (ref 150–440)
RBC: 4.96 10*6/uL (ref 3.80–5.20)
RDW: 16.1 % — AB (ref 11.5–14.5)
WBC: 4 10*3/uL (ref 3.6–11.0)

## 2013-08-18 LAB — BASIC METABOLIC PANEL
Anion Gap: 4 — ABNORMAL LOW (ref 7–16)
BUN: 17 mg/dL (ref 7–18)
CHLORIDE: 107 mmol/L (ref 98–107)
CO2: 28 mmol/L (ref 21–32)
CREATININE: 1 mg/dL (ref 0.60–1.30)
Calcium, Total: 8.3 mg/dL — ABNORMAL LOW (ref 8.5–10.1)
EGFR (Non-African Amer.): 50 — ABNORMAL LOW
GFR CALC AF AMER: 58 — AB
GLUCOSE: 101 mg/dL — AB (ref 65–99)
Osmolality: 279 (ref 275–301)
Potassium: 3.4 mmol/L — ABNORMAL LOW (ref 3.5–5.1)
Sodium: 139 mmol/L (ref 136–145)

## 2013-08-18 LAB — TROPONIN I

## 2013-08-19 ENCOUNTER — Ambulatory Visit: Payer: Self-pay | Admitting: Neurology

## 2013-08-19 LAB — BASIC METABOLIC PANEL
Anion Gap: 4 — ABNORMAL LOW (ref 7–16)
BUN: 9 mg/dL (ref 7–18)
CHLORIDE: 111 mmol/L — AB (ref 98–107)
CO2: 25 mmol/L (ref 21–32)
Calcium, Total: 8.9 mg/dL (ref 8.5–10.1)
Creatinine: 0.76 mg/dL (ref 0.60–1.30)
EGFR (Non-African Amer.): >60
Glucose: 104 mg/dL — ABNORMAL HIGH (ref 65–99)
OSMOLALITY: 278 (ref 275–301)
Potassium: 4.2 mmol/L (ref 3.5–5.1)
Sodium: 140 mmol/L (ref 136–145)

## 2013-08-20 LAB — MAGNESIUM: Magnesium: 2 mg/dL

## 2013-08-21 LAB — BASIC METABOLIC PANEL
Anion Gap: 2 — ABNORMAL LOW (ref 7–16)
BUN: 14 mg/dL (ref 7–18)
CO2: 25 mmol/L (ref 21–32)
Calcium, Total: 8.9 mg/dL (ref 8.5–10.1)
Chloride: 112 mmol/L — ABNORMAL HIGH (ref 98–107)
Creatinine: 0.96 mg/dL (ref 0.60–1.30)
EGFR (Non-African Amer.): 52 — ABNORMAL LOW
Glucose: 84 mg/dL (ref 65–99)
Osmolality: 277 (ref 275–301)
Potassium: 4.5 mmol/L (ref 3.5–5.1)
SODIUM: 139 mmol/L (ref 136–145)

## 2013-08-23 LAB — COMPREHENSIVE METABOLIC PANEL
ALK PHOS: 80 U/L
Albumin: 3.1 g/dL — ABNORMAL LOW (ref 3.4–5.0)
Anion Gap: 7 (ref 7–16)
BUN: 18 mg/dL (ref 7–18)
Bilirubin,Total: 0.6 mg/dL (ref 0.2–1.0)
CALCIUM: 8.9 mg/dL (ref 8.5–10.1)
Chloride: 107 mmol/L (ref 98–107)
Co2: 25 mmol/L (ref 21–32)
Creatinine: 0.95 mg/dL (ref 0.60–1.30)
EGFR (African American): >60
EGFR (Non-African Amer.): 53 — ABNORMAL LOW
Glucose: 93 mg/dL (ref 65–99)
OSMOLALITY: 279 (ref 275–301)
Potassium: 4.1 mmol/L (ref 3.5–5.1)
SGOT(AST): 17 U/L (ref 15–37)
SGPT (ALT): 19 U/L (ref 12–78)
Sodium: 139 mmol/L (ref 136–145)
TOTAL PROTEIN: 5.9 g/dL — AB (ref 6.4–8.2)

## 2014-02-14 ENCOUNTER — Encounter: Payer: Self-pay | Admitting: Physician Assistant

## 2014-03-13 ENCOUNTER — Encounter: Payer: Self-pay | Admitting: Physician Assistant

## 2014-04-12 ENCOUNTER — Encounter: Payer: Self-pay | Admitting: Physician Assistant

## 2014-05-13 ENCOUNTER — Encounter: Payer: Self-pay | Admitting: Physician Assistant

## 2014-06-12 ENCOUNTER — Encounter: Payer: Self-pay | Admitting: Physician Assistant

## 2014-07-13 ENCOUNTER — Encounter: Payer: Self-pay | Admitting: Physician Assistant

## 2014-08-13 ENCOUNTER — Encounter: Payer: Self-pay | Admitting: Physician Assistant

## 2014-09-11 ENCOUNTER — Encounter
Admit: 2014-09-11 | Disposition: A | Discharge status: Home / Self Care | Payer: Self-pay | Attending: Physician Assistant | Admitting: Physician Assistant

## 2014-10-12 ENCOUNTER — Encounter
Admit: 2014-10-12 | Disposition: A | Discharge status: Home / Self Care | Payer: Self-pay | Attending: Physician Assistant | Admitting: Physician Assistant

## 2014-10-23 ENCOUNTER — Inpatient Hospital Stay
Admit: 2014-10-23 | Disposition: A | Discharge status: Home / Self Care | Payer: Self-pay | Attending: Internal Medicine | Admitting: Internal Medicine

## 2014-10-23 LAB — COMPREHENSIVE METABOLIC PANEL
ALT: 11 U/L — AB
ANION GAP: 6 — AB (ref 7–16)
Albumin: 2.8 g/dL — ABNORMAL LOW
Alkaline Phosphatase: 80 U/L
BUN: 21 mg/dL — AB
Bilirubin,Total: 1.1 mg/dL
CHLORIDE: 102 mmol/L
CO2: 27 mmol/L
CREATININE: 0.82 mg/dL
Calcium, Total: 8.1 mg/dL — ABNORMAL LOW
GLUCOSE: 96 mg/dL
Potassium: 3.3 mmol/L — ABNORMAL LOW
SGOT(AST): 19 U/L
SODIUM: 135 mmol/L
TOTAL PROTEIN: 5.8 g/dL — AB

## 2014-10-23 LAB — CBC
HCT: 40.5 % (ref 35.0–47.0)
HGB: 12.6 g/dL (ref 12.0–16.0)
MCH: 24.7 pg — ABNORMAL LOW (ref 26.0–34.0)
MCHC: 31.2 g/dL — AB (ref 32.0–36.0)
MCV: 79 fL — AB (ref 80–100)
Platelet: 256 10*3/uL (ref 150–440)
RBC: 5.11 10*6/uL (ref 3.80–5.20)
RDW: 16.4 % — AB (ref 11.5–14.5)
WBC: 6.1 10*3/uL (ref 3.6–11.0)

## 2014-10-23 LAB — URINALYSIS, COMPLETE
Bilirubin,UR: NEGATIVE
Glucose,UR: NEGATIVE mg/dL (ref 0–75)
NITRITE: NEGATIVE
Ph: 5 (ref 4.5–8.0)
Specific Gravity: 1.02 (ref 1.003–1.030)

## 2014-10-23 LAB — PROTIME-INR
INR: 1.1
PROTHROMBIN TIME: 14.3 s

## 2014-10-23 LAB — APTT: Activated PTT: 27.3 secs (ref 23.6–35.9)

## 2014-10-24 LAB — CBC WITH DIFFERENTIAL/PLATELET
BASOS ABS: 0 10*3/uL (ref 0.0–0.1)
Basophil #: 0 10*3/uL (ref 0.0–0.1)
Basophil %: 0.5 %
Basophil %: 0.7 %
EOS ABS: 0.1 10*3/uL (ref 0.0–0.7)
Eosinophil #: 0.1 10*3/uL (ref 0.0–0.7)
Eosinophil %: 1.4 %
Eosinophil %: 2.5 %
HCT: 37.5 % (ref 35.0–47.0)
HCT: 35.2 % (ref 35.0–47.0)
HGB: 11.8 g/dL — ABNORMAL LOW (ref 12.0–16.0)
HGB: 11.3 g/dL — ABNORMAL LOW (ref 12.0–16.0)
LYMPHS ABS: 1 10*3/uL (ref 1.0–3.6)
LYMPHS PCT: 23.4 %
Lymphocyte #: 1 10*3/uL (ref 1.0–3.6)
Lymphocyte %: 28.1 %
MCH: 24.7 pg — ABNORMAL LOW (ref 26.0–34.0)
MCH: 25.3 pg — ABNORMAL LOW (ref 26.0–34.0)
MCHC: 31.4 g/dL — AB (ref 32.0–36.0)
MCHC: 32.2 g/dL (ref 32.0–36.0)
MCV: 79 fL — ABNORMAL LOW (ref 80–100)
MCV: 79 fL — ABNORMAL LOW (ref 80–100)
MONO ABS: 0.5 x10 3/mm (ref 0.2–0.9)
MONOS PCT: 11.2 %
MONOS PCT: 14.3 %
Monocyte #: 0.5 x10 3/mm (ref 0.2–0.9)
NEUTROS ABS: 2.7 10*3/uL (ref 1.4–6.5)
NEUTROS ABS: 1.9 10*3/uL (ref 1.4–6.5)
Neutrophil %: 63.5 %
Neutrophil %: 54.4 %
PLATELETS: 212 10*3/uL (ref 150–440)
Platelet: 226 10*3/uL (ref 150–440)
RBC: 4.78 10*6/uL (ref 3.80–5.20)
RBC: 4.48 10*6/uL (ref 3.80–5.20)
RDW: 16.5 % — ABNORMAL HIGH (ref 11.5–14.5)
RDW: 16.6 % — ABNORMAL HIGH (ref 11.5–14.5)
WBC: 4.2 10*3/uL (ref 3.6–11.0)
WBC: 3.6 10*3/uL (ref 3.6–11.0)

## 2014-10-24 LAB — HEMOGLOBIN: HGB: 11.4 g/dL — ABNORMAL LOW (ref 12.0–16.0)

## 2014-10-25 LAB — CLOSTRIDIUM DIFFICILE(ARMC)

## 2014-10-25 LAB — HEMOGLOBIN: HGB: 11.2 g/dL — ABNORMAL LOW (ref 12.0–16.0)

## 2014-10-26 LAB — POTASSIUM: POTASSIUM: 3.3 mmol/L — AB

## 2014-10-26 LAB — HEMOGLOBIN: HGB: 11.7 g/dL — ABNORMAL LOW (ref 12.0–16.0)

## 2014-11-01 ENCOUNTER — Inpatient Hospital Stay
Admit: 2014-11-01 | Disposition: A | Discharge status: Skilled Nursing Facility | Payer: Self-pay | Attending: Internal Medicine | Admitting: Internal Medicine

## 2014-11-01 LAB — COMPREHENSIVE METABOLIC PANEL
ALBUMIN: 2.3 g/dL — AB
ALT: 9 U/L — AB
ANION GAP: 5 — AB (ref 7–16)
Alkaline Phosphatase: 72 U/L
BUN: 17 mg/dL
Bilirubin,Total: 0.8 mg/dL
CHLORIDE: 104 mmol/L
Calcium, Total: 7.6 mg/dL — ABNORMAL LOW
Co2: 27 mmol/L
Creatinine: 0.65 mg/dL
EGFR (African American): >60
EGFR (Non-African Amer.): >60
Glucose: 103 mg/dL — ABNORMAL HIGH
Potassium: 3.3 mmol/L — ABNORMAL LOW
SGOT(AST): 15 U/L
Sodium: 136 mmol/L
Total Protein: 5.3 g/dL — ABNORMAL LOW

## 2014-11-01 LAB — CBC
HCT: 37 % (ref 35.0–47.0)
HGB: 11.5 g/dL — ABNORMAL LOW (ref 12.0–16.0)
MCH: 24.2 pg — ABNORMAL LOW (ref 26.0–34.0)
MCHC: 31 g/dL — AB (ref 32.0–36.0)
MCV: 78 fL — ABNORMAL LOW (ref 80–100)
Platelet: 267 10*3/uL (ref 150–440)
RBC: 4.76 10*6/uL (ref 3.80–5.20)
RDW: 16.8 % — AB (ref 11.5–14.5)
WBC: 6.8 10*3/uL (ref 3.6–11.0)

## 2014-11-01 LAB — URINALYSIS, COMPLETE
BACTERIA: NONE SEEN
Bilirubin,UR: NEGATIVE
Blood: NEGATIVE
GLUCOSE, UR: NEGATIVE mg/dL (ref 0–75)
Leukocyte Esterase: NEGATIVE
Nitrite: NEGATIVE
Ph: 5 (ref 4.5–8.0)
SPECIFIC GRAVITY: 1.026 (ref 1.003–1.030)

## 2014-11-01 LAB — PROTIME-INR
INR: 1.1
Prothrombin Time: 14.6 secs

## 2014-11-01 LAB — MAGNESIUM: MAGNESIUM: 1.7 mg/dL

## 2014-11-01 LAB — TROPONIN I

## 2014-11-01 LAB — LIPASE, BLOOD: Lipase: 23 U/L

## 2014-11-02 LAB — CBC WITH DIFFERENTIAL/PLATELET
Basophil #: 0 10*3/uL (ref 0.0–0.1)
Basophil %: 0.4 %
EOS ABS: 0 10*3/uL (ref 0.0–0.7)
Eosinophil %: 0.5 %
HCT: 33.1 % — ABNORMAL LOW (ref 35.0–47.0)
HGB: 10.5 g/dL — ABNORMAL LOW (ref 12.0–16.0)
Lymphocyte #: 0.6 10*3/uL — ABNORMAL LOW (ref 1.0–3.6)
Lymphocyte %: 14.8 %
MCH: 24.8 pg — AB (ref 26.0–34.0)
MCHC: 31.6 g/dL — AB (ref 32.0–36.0)
MCV: 78 fL — ABNORMAL LOW (ref 80–100)
Monocyte #: 0.4 x10 3/mm (ref 0.2–0.9)
Monocyte %: 10.4 %
NEUTROS PCT: 73.9 %
Neutrophil #: 3.1 10*3/uL (ref 1.4–6.5)
PLATELETS: 241 10*3/uL (ref 150–440)
RBC: 4.23 10*6/uL (ref 3.80–5.20)
RDW: 16.7 % — ABNORMAL HIGH (ref 11.5–14.5)
WBC: 4.1 10*3/uL (ref 3.6–11.0)

## 2014-11-02 LAB — BASIC METABOLIC PANEL
ANION GAP: 7 (ref 7–16)
BUN: 13 mg/dL
CALCIUM: 7.4 mg/dL — AB
CO2: 26 mmol/L
Chloride: 106 mmol/L
Creatinine: 0.67 mg/dL
EGFR (Non-African Amer.): >60
GLUCOSE: 87 mg/dL
Potassium: 3.4 mmol/L — ABNORMAL LOW
SODIUM: 139 mmol/L

## 2014-11-02 LAB — TROPONIN I
Troponin-I: <0.03 ng/mL
Troponin-I: <0.03 ng/mL

## 2014-11-03 LAB — CBC WITH DIFFERENTIAL/PLATELET
Basophil #: 0 x10 3/mm 3
Basophil %: 0.3 %
Eosinophil #: 0 x10 3/mm 3
Eosinophil %: 0.9 %
HCT: 40.6 %
HGB: 12.4 g/dL
Lymphocyte %: 14.2 %
Lymphs Abs: 0.8 x10 3/mm 3 — ABNORMAL LOW
MCH: 24.3 pg — ABNORMAL LOW
MCHC: 30.6 g/dL — ABNORMAL LOW
MCV: 79 fL — ABNORMAL LOW
Monocyte #: 0.5 "x10 3/mm "
Monocyte %: 8.3 %
Neutrophil #: 4.3 x10 3/mm 3
Neutrophil %: 76.3 %
Platelet: 261 x10 3/mm 3
RBC: 5.11 X10 6/mm 3
RDW: 16.9 % — ABNORMAL HIGH
WBC: 5.6 x10 3/mm 3

## 2014-11-03 LAB — BASIC METABOLIC PANEL WITH GFR
Anion Gap: 4 — ABNORMAL LOW
BUN: 9 mg/dL
Calcium, Total: 7.5 mg/dL — ABNORMAL LOW
Chloride: 109 mmol/L
Co2: 25 mmol/L
Creatinine: 0.62 mg/dL
EGFR (African American): >60
EGFR (Non-African Amer.): >60
Glucose: 102 mg/dL — ABNORMAL HIGH
Potassium: 4 mmol/L
Sodium: 138 mmol/L

## 2014-11-03 LAB — CLOSTRIDIUM DIFFICILE(ARMC)

## 2014-11-03 NOTE — Discharge Summary (Signed)
PATIENT NAME:  Anna [name], Eiko B MR#:  [id number] DATE OF BIRTH:  [birth year]  DATE OF ADMISSION:  08/18/2013 DATE OF DISCHARGE:  08/23/2013   DISCHARGE DIAGNOSES:  1. Acute cerebrovascular accident.  2. Hypertension.  3. Chronic anxiety.   DISCHARGE MEDICATIONS:  1. Protonix 40 mg p.o. daily.  2. Acetaminophen 325 mg 2 tabs p.o. q.6 hours as needed for pain and fever.  3. Tramadol 50 mg 1 tab p.o. at bedtime as needed for pain.  4. Lisinopril 5 mg p.o. daily.  5. Simvastatin 10 mg p.o. at bedtime.  6. Aspirin 325 mg p.o. daily.  7. Metoprolol 25 mg p.o. b.i.d.   CONSULTATIONS: None.   PROCEDURES: None.   PERTINENT LABORATORIES AND STUDIES: MRI showed a left basal ganglia and corona radiata of the right pons small vessel infarct that is acute. Prior to discharge, LDL of 80, sodium 139, potassium 4.1 and creatinine 0.95.   BRIEF HOSPITAL COURSE:  1. The patient initially came in with signs of acute stroke. The patient has acute right-sided weakness and also has dysarthria. CT showed a small infarct. MRI of the brain confirmed a small vessel infarct at the left basal ganglia and the right corona radiata in the right pons. She was evaluated by physical therapy and speech therapy. She is going to need further rehab. She is currently placed on aspirin and on statin and also on an ACE inhibitor. Blood pressure has been stable here. She has had no further strokelike symptoms in addition to what she already has. She is being discharged to a skilled nursing facility for further rehab.  2. Other chronic issues are stable at this time.   DISPOSITION: She is stable for discharge to a skilled nursing facility. Follow up with Dr. Burnadette PopLinthavong after discharge from the facility.   ____________________________ Marisue IvanKanhka Alleene Stoy, MD kl:lb D: 08/23/2013 08:34:21 ET T: 08/23/2013 09:06:59 ET JOB#: [id number]  cc: Marisue IvanKanhka Atalie Oros, MD, <Dictator> Marisue IvanKANHKA Loretto Belinsky MD ELECTRONICALLY SIGNED 09/01/2013 10:51

## 2014-11-03 NOTE — Consult Note (Signed)
PATIENT NAME:  [name], Ramaya B MR#:  [id number] DATE OF BIRTH:  December 23, 1921  DATE OF CONSULTATION:  08/19/2013  REFERRING PHYSICIAN:   CONSULTING PHYSICIAN:  Pauletta BrownsYuriy Emera Bussie, MD  REASON FOR CONSULTATION:  Stroke.  This is a pleasant 81 year old female with limited past medical history, only on aspirin at home, lives at home baseline by herself requiring no assistance, noted to have slurred speech, right facial droop and right-sided weakness around 4:00 p.m. yesterday, decided to go back in the hospital when the symptoms were not improving and the patient came to the Emergency Department. A CAT scan did not show any acute intracranial pathology. On admission the patient had elevated blood pressure of 215/80. The patient's MRI has been reviewed. MRI shows a subacute to acute left basal ganglia ischemic stroke with some questionable right pontine involvement.   PAST MEDICAL HISTORY: 1.  Hypertension.  2.  Diverticulosis.   PAST SURGICAL HISTORY:  1.  Appendectomy. 2.  Cholecystectomy.   ALLERGIES:  No known drug allergies.   HOME MEDICATIONS: 1.  Protonix. 2.  Aspirin.   SOCIAL HISTORY:  No history of EtOH use drug use lives at home.   FAMILY HISTORY:  Mother died in at the age of over 6980. Father died of pancreatic cancer.   IMAGING:  Carotid Doppler performed. No significant hemodynamic stenosis bilaterally. MRI as described above. CT head no acute intracranial pathology.  VITAL SIGNS:  Temperature is 97.5, pulse 75, respirations 16, blood pressure 157/50, saturating well on room air 97%.   PHYSICAL EXAMINATION:  The patient is easily arousable, could tell me her name, location and could tell me who her granddaughters were who were currently at bedside. She appears to be dysarthric and speech is not fluent. Extraocular movements appear to be intact. The patient has a right facial droop. Tongue is midline. Able to follow commands. Close eyes symmetrically. Motor strength, shoulder shrug is  intact. Right upper extremity significant drift. She is 3/5 right upper extremity. Right lower extremity is 4 to 4+/5. The rest are 5/5. Coordination:  Finger-to-nose intact on the left, limited on the right. Reflexes severely diminished. Sensation is intact to light touch, possibility of right neglect on the right side. Gait not assessed   IMPRESSION:  An 81 year old healthy female with no significant past medical history, lives alone, found to have slurred speech, right facial droop, right upper extremity weakness. In review of her MRI, I see a left basal ganglia ischemic infarct, which is small in size. She has, otherwise, a lot of white matter changes consistent with chronic ischemia. There is a questionable infarct that I see, it could be the right midbrain and the left pons at the same time. This could be artifact in nature as well since they are so small.   PLAN:  Physical therapy, occupational therapy, aspirin that she is on, statin. Carotids reviewed. The patient is waiting for 2-D echocardiogram to be read. Continue physical therapy, occupational therapy. She passed Speech with a pureed diet with nectar-thicks. Social Work/Case Management as I do not think this patient can be discharged home. After her visit probably will require acute rehab. This case was discussed with patient's granddaughters who are at the bedside.   Thank you, it was a pleasure seeing this patient.   ____________________________ Pauletta BrownsYuriy Billy Turvey, MD yz:jm D: 08/19/2013 14:05:47 ET T: 08/19/2013 14:40:16 ET JOB#: [id number]  cc: Pauletta BrownsYuriy Mattew Chriswell, MD, <Dictator> Pauletta BrownsYURIY Yisroel Mullendore MD ELECTRONICALLY SIGNED 09/04/2013 13:14

## 2014-11-03 NOTE — H&P (Signed)
PATIENT NAME:  Anna, [name] MR#:  [id number] DATE OF BIRTH:  [birth year]  DATE OF ADMISSION:  08/18/2013  PRIMARY CARE PHYSICIAN: Dr. Marisue Ivan.   REFERRING PHYSICIAN: Dr. Lenard Lance.   CHIEF COMPLAINT: Slurred speech, right-sided weakness.   HISTORY OF PRESENT ILLNESS: Anna [name] is an 81 year old, pleasant, white female with no past medical history, well functional at baseline, independent of ADLs and IADLs. Was noted to require assistance  this afternoon. The patient's sister helped her to get into the car. The patient drove by herself to her house. After going home around 2 in the afternoon, called her daughter stating that she was not feeling well. Around 5:30, the patient was noted to somewhat be having slurred speech. The patient was staggering towards the right side. The patient's family waited until 7:30. The patient insisted for them go to their houses; however, at 9:00, came back and when checked on the patient, concerned about her and brought her to the Emergency Department. Workup in the Emergency Department: CT head without contrast was unremarkable. The patient was noted to have blood pressure of 215/80. The patient looked anxious for which the ER physician administered Ativan which caused her to be more confused. The history is mainly obtained from the patient's daughters.   PAST MEDICAL HISTORY:  1. Hypertension.  2. Diverticulosis.   PAST SURGICAL HISTORY:  1. Appendectomy.  2. Cholecystectomy.   ALLERGIES: No known drug allergies.   HOME MEDICATIONS: Protonix 40 mg daily.   SOCIAL HISTORY: No history of smoking, drinking alcohol or using illicit drugs. Lives by herself. Independent of ADLs and IADLs.   FAMILY HISTORY: Mother lived to be over 100. Father died of pancreatic cancer and also has history of MI.    REVIEW OF SYSTEMS: Unable to obtain from the patient secondary to altered mental status.   PHYSICAL EXAMINATION:  GENERAL: This is a well-built,  well-nourished, age-appropriate female lying down in the bed, not in distress.  VITAL SIGNS: Temperature 98.8, pulse 102, blood pressure , respiratory rate of 16, oxygen saturation 96% on room air.  HEENT: Head normocephalic, atraumatic. There is no scleral icterus. Conjunctivae normal. Pupils equal and react to light. Extraocular movements are intact. Mucous membranes: Mild dryness. Could not examine the oropharynx.  NECK: Supple. No lymphadenopathy. No JVD. No carotid bruit.  CHEST: Has no focal tenderness.  LUNGS: Bilaterally clear to auscultation.  HEART: S1, S2 regular. No murmurs are heard. No pedal edema. Pulses 2+.  ABDOMEN: Bowel sounds present. Soft, nontender, nondistended. No hepatosplenomegaly.  NEUROLOGIC: The patient is somewhat somnolent. Oriented to self and person but not to time. Has some slurred speech. No pronator drift. Babinski downgoing. Moving all 4 extremities.   LABORATORIES: CMP is completely within normal limits. Troponin less than 0.02. CBC is completely within normal limits.   CT head without contrast: Chronic involutional changes without evidence of focal or acute abnormalities.   Chest x-ray, 1 view portable: No acute cardiopulmonary disease.   ASSESSMENT AND PLAN: Anna [name] is an 81 year old female who comes to the Emergency Department with slurred speech, possible right-sided weakness.  1. Transient ischemic attack/cerebrovascular accident: Admit the patient to a monitored bed. Obtain MRI of the brain in the morning. Obtain carotid Dopplers and echocardiogram. Keep the patient on aspirin 325 mg daily. Obtain lipid profile. Start the patient on Lipitor. Will involve speech therapy, physical therapy and occupational therapy in the morning.  2. Hypertension secondary to stroke:   3. Keep the patient on deep vein  thrombosis prophylaxis with Lovenox.   TIME SPENT: 45 minutes.   ____________________________ Anna GriffinsPadmaja Stina Gane, MD pv:gb D: 08/18/2013 00:54:55  ET T: 08/18/2013 02:00:05 ET JOB#: [id number]  cc: Anna GriffinsPadmaja Hulet Ehrmann, MD, <Dictator> Marisue IvanKanhka Linthavong, MD Anna GriffinsPADMAJA Clarance Bollard MD ELECTRONICALLY SIGNED 08/20/2013 4:21

## 2014-11-04 LAB — BASIC METABOLIC PANEL
ANION GAP: 4 — AB (ref 7–16)
BUN: 8 mg/dL
CHLORIDE: 110 mmol/L
Calcium, Total: 7.5 mg/dL — ABNORMAL LOW
Co2: 24 mmol/L
Creatinine: 0.57 mg/dL
EGFR (Non-African Amer.): >60
GLUCOSE: 78 mg/dL
Potassium: 3.7 mmol/L
SODIUM: 138 mmol/L

## 2014-11-04 LAB — CBC WITH DIFFERENTIAL/PLATELET
BASOS PCT: 0.3 %
Basophil #: 0 10*3/uL (ref 0.0–0.1)
EOS ABS: 0.1 10*3/uL (ref 0.0–0.7)
EOS PCT: 1.2 %
HCT: 33.7 % — AB (ref 35.0–47.0)
HGB: 10.7 g/dL — AB (ref 12.0–16.0)
LYMPHS PCT: 21.1 %
Lymphocyte #: 0.9 10*3/uL — ABNORMAL LOW (ref 1.0–3.6)
MCH: 24.7 pg — ABNORMAL LOW (ref 26.0–34.0)
MCHC: 31.7 g/dL — AB (ref 32.0–36.0)
MCV: 78 fL — ABNORMAL LOW (ref 80–100)
Monocyte #: 0.5 x10 3/mm (ref 0.2–0.9)
Monocyte %: 11.8 %
NEUTROS ABS: 2.7 10*3/uL (ref 1.4–6.5)
Neutrophil %: 65.6 %
Platelet: 247 10*3/uL (ref 150–440)
RBC: 4.32 10*6/uL (ref 3.80–5.20)
RDW: 16.8 % — ABNORMAL HIGH (ref 11.5–14.5)
WBC: 4.2 10*3/uL (ref 3.6–11.0)

## 2014-11-05 LAB — CBC WITH DIFFERENTIAL/PLATELET
BASOS PCT: 0.5 %
Basophil #: 0 10*3/uL (ref 0.0–0.1)
EOS ABS: 0.1 10*3/uL (ref 0.0–0.7)
Eosinophil %: 2 %
HCT: 34.9 % — ABNORMAL LOW (ref 35.0–47.0)
HGB: 11.2 g/dL — AB (ref 12.0–16.0)
Lymphocyte #: 0.9 10*3/uL — ABNORMAL LOW (ref 1.0–3.6)
Lymphocyte %: 21.1 %
MCH: 25.2 pg — ABNORMAL LOW (ref 26.0–34.0)
MCHC: 32.2 g/dL (ref 32.0–36.0)
MCV: 78 fL — ABNORMAL LOW (ref 80–100)
Monocyte #: 0.4 x10 3/mm (ref 0.2–0.9)
Monocyte %: 10 %
NEUTROS ABS: 2.9 10*3/uL (ref 1.4–6.5)
NEUTROS PCT: 66.4 %
PLATELETS: 283 10*3/uL (ref 150–440)
RBC: 4.46 10*6/uL (ref 3.80–5.20)
RDW: 16.8 % — ABNORMAL HIGH (ref 11.5–14.5)
WBC: 4.3 10*3/uL (ref 3.6–11.0)

## 2014-11-05 LAB — BASIC METABOLIC PANEL
Anion Gap: 7 (ref 7–16)
BUN: 8 mg/dL
CREATININE: 0.6 mg/dL
Calcium, Total: 7.7 mg/dL — ABNORMAL LOW
Chloride: 107 mmol/L
Co2: 27 mmol/L
EGFR (African American): >60
Glucose: 86 mg/dL
POTASSIUM: 3.4 mmol/L — AB
Sodium: 141 mmol/L

## 2014-11-06 LAB — BASIC METABOLIC PANEL
Anion Gap: 5 — ABNORMAL LOW (ref 7–16)
BUN: 9 mg/dL
Calcium, Total: 7.6 mg/dL — ABNORMAL LOW
Chloride: 107 mmol/L
Co2: 27 mmol/L
Creatinine: 0.58 mg/dL
EGFR (African American): >60
EGFR (Non-African Amer.): >60
GLUCOSE: 90 mg/dL
Potassium: 3.4 mmol/L — ABNORMAL LOW
SODIUM: 139 mmol/L

## 2014-11-06 LAB — CBC WITH DIFFERENTIAL/PLATELET
Basophil #: 0 10*3/uL (ref 0.0–0.1)
Basophil %: 0.4 %
EOS PCT: 2.5 %
Eosinophil #: 0.1 10*3/uL (ref 0.0–0.7)
HCT: 32.4 % — AB (ref 35.0–47.0)
HGB: 10.6 g/dL — ABNORMAL LOW (ref 12.0–16.0)
LYMPHS ABS: 1 10*3/uL (ref 1.0–3.6)
Lymphocyte %: 23.7 %
MCH: 25.3 pg — ABNORMAL LOW (ref 26.0–34.0)
MCHC: 32.8 g/dL (ref 32.0–36.0)
MCV: 77 fL — ABNORMAL LOW (ref 80–100)
MONO ABS: 0.5 x10 3/mm (ref 0.2–0.9)
Monocyte %: 11.2 %
Neutrophil #: 2.7 10*3/uL (ref 1.4–6.5)
Neutrophil %: 62.2 %
Platelet: 268 10*3/uL (ref 150–440)
RBC: 4.2 10*6/uL (ref 3.80–5.20)
RDW: 16.8 % — ABNORMAL HIGH (ref 11.5–14.5)
WBC: 4.3 10*3/uL (ref 3.6–11.0)

## 2014-11-06 LAB — FERRITIN: Ferritin (ARMC): 50 ng/mL

## 2014-11-09 LAB — STOOL CULTURE

## 2014-11-11 NOTE — Discharge Summary (Signed)
PATIENT NAME:  Anna [name], GUYETTE MR#:  [id number] DATE OF BIRTH:  1921/10/05  DATE OF ADMISSION:  11/01/2014 DATE OF DISCHARGE:  11/06/2014  PRIMARY CARE PHYSICIAN:  Marisue Ivan, M.D.   ADMITTING COMPLAINT: Nausea and abdominal pain.   DISCHARGE DIAGNOSES:  1.  Nausea and abdominal pain likely due to constipation.  2.  History of cerebrovascular accident with residual right-sided weakness.  3.  Hypertension.  4.  Hyperlipidemia.  5.  Diverticulosis.   CONSULTATIONS: Scot Jun, M.D. of gastroenterology.   PROCEDURES:  Abdomen 3-way film shows no acute cardiopulmonary disease, moderate colonic stool burden with potential bowel wall thickening involving transverse and descending colon possibly accentuated due to under distention, though could be seen in the setting of enteritis.  Clinical correlation advised.  No evidence of enteric obstruction.   HISTORY OF PRESENT ILLNESS: This 81 year old female with past medical history of recent admission for nausea, vomiting, diarrhea, discharged on ciprofloxacin to home returns with recurrent episodes of nausea and vomiting as well as diarrhea. She returned to the hospital with subjective fever, nausea, vomiting, and diarrhea as well as abdominal pain. On presentation, she was tachycardic to 120 to 130 range in sinus tachycardia.    HOSPITAL COURSE BY PROBLEM: 1.  Nausea, vomiting, and diarrhea: The patient was seen by gastroenterology during this hospitalization and due to the moderate stool burden seen on her x-ray it was felt that she was likely constipated with some abdominal cramping and stool leakage around the constipation. She did have a heme positive stool which was most likely due to irritation. Her Clostridium difficile antigen was negative. Stool cultures were negative except for a Staphylococcus aureus which was likely a contaminant. Antibiotics were discontinued and she has actually done very well.  At the time of discharge, she is  having no further abdominal pain and she has had several normal, though small, bowel movements. No further diarrhea. She is tolerating a regular diet.  2.  History of CVA with residual right-sided hemiparesis: This deficit is very frustrating for the patient. After 2 hospitalizations, she is much weaker than her baseline. Physical therapy has assessed her and determined that she needs skilled nursing level of care for further rehabilitation. Family is in agreement and she is being discharged to skilled nursing.  3.  Hypertension: Blood pressures were well controlled during the hospitalization, though creeping upwards, and she is being discharged on her former home regimen.  4.  Hyperlipidemia: She continues on the statin medication.  5.  History of CVA. She continues on blood pressure control, lipids, and aspirin 325 mg daily.   DISCHARGE PHYSICAL EXAMINATION:  VITAL SIGNS: Temperature 97.5, pulse 81, respirations 20, blood pressure 149/85, oxygenation 95% on room air.  GENERAL: No acute distress.  CARDIOVASCULAR: Regular rate and rhythm. No murmurs, rubs, or gallops. No peripheral edema. Peripheral pulses 1+.  RESPIRATORY: Lungs are clear to auscultation bilaterally with good air movement.  PSYCHIATRIC: She is alert and oriented and does have good insight into her clinical conditions. She does seem occasionally very depressed or distressed due to multiple medical problem. No signs of uncontrolled emotional disorder at the time of discharge.   LABORATORY DATA: Sodium 139, potassium 3.4, chloride 107, bicarbonate 27, BUN 9, creatinine 0.58, glucose 90. LFTs with decreased total protein at 5.3, decreased albumin at 2.4, otherwise normal. Troponins negative x 3. White blood cells 4.3, hemoglobin 10.6, platelets 267,000, MCV is 77.  Ferritin is 50.  Stool for Clostridium difficile is negative. Stool culture  showing light growth Staphylococcus aureus, most likely a contaminant. No Campylobacter or Salmonella  noted. Urinalysis is negative for infection.   DISCHARGE MEDICATIONS:  1.  Simvastatin 20 mg 1 tablet daily.  2.  Metoprolol tartrate 25 mg 1/2 tablet twice a day.  3.  Aspirin 325 mg 1 tablet daily.  4.  Lisinopril 2.5 mg 1 tablet daily.  5.  Pantoprazole 40 mg 1 tablet daily.  6.  Montelukast 10 mg 1 tablet once a day in the morning.  7.  Cetirizine 10 mg 1 tablet once a day at bedtime.  8.  Zinc oxide topical 1 application 3 times a day.  9.  Boost Breeze nutritional supplement 237 mL 3 times a day with meals.  10.  Hyoscyamine 0.125 mg sublingual every 6 hours as needed for gastrointestinal distress.   CONDITION ON DISCHARGE: Stable.   DISPOSITION: Discharge to skilled nursing facility.   DISCHARGE INSTRUCTIONS:  DIET: Low sodium, low fat, low cholesterol, high fiber. ACTIVITY: As tolerated and per physical therapy recommendations.   TIME FRAME FOR FOLLOWUP APPOINTMENT:  Please follow up in 1 to 2 weeks with your primary care physician.   TIME SPENT ON DISCHARGE: 40 minutes.   ____________________________ Ena Dawleyatherine P. Clent RidgesWalsh, MD cpw:sp D: 11/06/2014 08:59:01 ET T: 11/06/2014 09:12:36 ET JOB#: [id number]  cc: Santina Evansatherine P. Clent RidgesWalsh, MD, <Dictator> Gale JourneyATHERINE P Mateya Torti MD ELECTRONICALLY SIGNED 11/08/2014 7:34

## 2014-11-11 NOTE — Consult Note (Signed)
Details:   - GI Note:  I agree with Ms London's a/p.  LGIB, mild based on hx and labs.  Now resolved.   Recs: - follow hgb - obtain tagged rbc scan if evidence of sig active bleeding. - no plans for colonoscopy.   Electronic Signatures: Dow Adolphein, Fatimata Talsma (MD)  (Signed 12-Apr-16 18:15)  Authored: Details   Last Updated: 12-Apr-16 18:15 by Dow Adolphein, Kal Chait (MD)

## 2014-11-11 NOTE — Consult Note (Signed)
Pt not sleeping well, had some abd pain which is gone now.  Asking for real food, will advance to low residue diet.  WBC 4.2, Hgb 10.7, plt 247, Met B nl except calcium of 7.5.  VSS afeb.  Family reports small amt of liquid brown stool.  My exam shows chest clear, abd flatter, soft, non tender.  Likely can go to rehab for 1-2 weeks and then home.  Electronic Signatures: Elliott, Robert T (MD)  (Signed on 24-Apr-16 11:35)  Authored  Last Updated: 24-Apr-16 11:35 by Elliott, Robert T (MD)  

## 2014-11-11 NOTE — Consult Note (Signed)
Pt doing better, no diarrhea since yesterday.  WBC 5.6, hgb 12.4, plt 261, VSS afebrile. Focal tenderness on abd exam.  Making progress.  Pt has speech difficulty but able to understand her with daughter help.  Continue current course.  Electronic Signatures: Scot JunElliott, Tiarna Koppen T (MD)  (Signed on 23-Apr-16 12:11)  Authored  Last Updated: 23-Apr-16 12:11 by Scot JunElliott, Mads Borgmeyer T (MD)

## 2014-11-11 NOTE — H&P (Addendum)
PATIENT NAME:  Anna [name], Anna [name] MR#:  [id number] DATE OF BIRTH:  [birth year]  DATE OF ADMISSION:  11/01/2014  PRIMARY CARE PHYSICIAN: Marisue IvanKanhka Linthavong, MD.    HISTORY OF PRESENT ILLNESS:  The patient is a 81 year old Caucasian female with past medical history significant for history of admission from the 12th through 10/26/2014 for nausea, vomiting, and diarrhea, who was discharged on ciprofloxacin to home, comes back to the hospital with recurrent episodes of nausea, vomiting, as well as diarrhea. The patient was admitted apparently on 10/23/2014 with complaints of rectal bleeding. She was also having diarrhea and abdominal cramps, and she was brought to Emergency Room for further evaluation.  She was seen by a gastroenterologist in the hospital and since her rectal bleeding somewhat subsided she was discharged home on ciprofloxacin and no radiologic studies were performed while in the hospital. She comes back to the hospital with complaints of nausea, vomiting, and diarrhea. She admits feeling feverish and having complaining of abdominal pains. Pain is described as intermittent, spasmic pain, increasing before she has a bowel movement. She is also having nausea, vomiting.  Her last vomitus was this morning. On arrival to the Emergency Room she was noted to be tachycardic with heart rate 120s-130s. Hospitalist services were contacted for admission.   PAST MEDICAL HISTORY: Significant for history of recent admission for rectal bleed, also diarrhea, dehydration, as well as urinary tract infection 12th to 10/26/2014, also history of hypertension, hyperlipidemia, stroke, history of diverticulosis, has been admitted with diverticular bleed, also history of hypertension, history of stroke with residual right deficits, last colonoscopy was in 2005 which showed large diverticula in the sigmoid colon, descending colon, as well as transverse colon. Also anxiety, osteopenia, osteoarthritis.   ALLERGIES: No known drug  allergies.   MEDICATIONS: Per medical records the patient is on Aleve 220 mg twice daily, aspirin 325 mg p.o. daily, cetirizine 10 mg p.o. daily, lisinopril 2.5 mg p.o. daily, metoprolol tartrate 25 mg half tablet twice daily, montelukast 10 mg daily, pantoprazole 40 mg p.o. daily, simvastatin 10 mg p.o. daily.   SOCIAL HISTORY: No smoking, alcohol, or drug abuse.   PAST SURGICAL HISTORY: Cholecystectomy as well as appendectomy. The patient's mother lived more than 100 years. Father had pancreatic cancer.   REVIEW OF SYSTEMS:  CONSTITUTIONAL:  Admits of having abdominal pain, as well as nausea, vomiting, and diarrhea, cramping pain in the abdomen. Denies any fevers, however felt feverish yesterday.  Admits to fatigue and weakness. No weight loss.  EYES:  Denies any blurry vision, double vision.   EARS, NOSE, AND THROAT: Denies any tinnitus, allergies, epistaxis, sinus pain, dentures, difficulty swallowing.  RESPIRATORY: Denies any cough, wheezes, asthma, COPD.  Admits of shortness of breath since her stroke.  CARDIOVASCULAR: Denies chest pains, arrhythmias, palpitations.  GASTROINTESTINAL: Admits of intermittent rectal bleeding, which seems to be subsiding.  GENITOURINARY: Denies dysuria, hematuria, frequency.  ENDOCRINE: Denies any polydipsia, nocturia, thyroid problems, heat or cold intolerance, or thirst.  HEMATOLOGIC: Denies anemia, easy bruising, bleeding recently.   MUSCULOSKELETAL: Denies arthritis, cramps, gout.   NEUROLOGIC:  No numbness, epilepsy, or tremor.  PSYCHIATRIC: Denies anxiety, insomnia, or depression.   PHYSICAL EXAMINATION:  VITAL SIGNS: On arrival to the hospital the patient's vital signs, temperature was 98, pulse was 125, respiration was 20, blood pressure 110/71, saturation was 95% on room air.  GENERAL: This is a well-developed, well-nourished Caucasian female in no significant distress, lying on the stretcher.  HEENT: Her pupils are equal and reactive to light.  Extraocular muscles intact. No icterus or conjunctivitis. Has normal hearing. No pharyngeal erythema. Mucosa is moist. The patient is dysarthric.  NECK: No masses. Supple, nontender. Thyroid not enlarged. No adenopathy. No JVD or carotid bruits bilaterally. Full range of motion.  LUNGS: Clear to auscultation in all fields.  Somewhat diminished breath sounds, but no significant wheezing, labored inspirations, increased effort, dullness to percussion, or overt respiratory distress.  CARDIOVASCULAR: S1, S2 appreciated.  Rhythm is regular, tachycardic. PMI not lateralized.  Chest is nontender to palpation. 1 + pedal pulses.  EXTREMITIES: No lower extremity edema, calf tenderness, or cyanosis was noted. ABDOMEN: Soft. Minimally uncomfortable in lower part of abdomen and no masses, no hepatosplenomegaly.  RECTAL: Was performed by Emergency Room physician and was heme-positive. The patient does have diminished bowel sounds in all quadrants.  MUSCULOSKELETAL:  Able to move her left side, however has right hemiparesis.  SKIN: No evidence of any rashes, lesions, erythema, nodularity, or induration. It was warm and dry to palpation.  LYMPHATIC: No adenopathy in the cervical region.  NEUROLOGIC: Cranial nerves grossly intact. Sensory is intact. No dysarthria or aphasia. The patient is alert, oriented to time, person, and place, cooperative. Memory is good.  PSYCHIATRIC: No significant confusion, agitation, or depression noted.   LABORATORY DATA: Glucose of 103, potassium 3.3, otherwise unremarkable study. Lipase level 23. Liver enzymes, albumin level of 2.3 which is declining precipitously since February 2015.  CBC, white blood cell count 6.8, hemoglobin 11.5, platelet count was 267,000, MCV low at 78. Coagulation panel was unremarkable. Urinalysis was unremarkable.   RADIOLOGIC STUDIES: 3 view abdomen including PA of chest showed no acute cardiopulmonary disease, moderate colonic stool burden with potential  bowel wall thickening involving the transverse as well as ascending colon, possibly accentuated due to underdistention that could be seen in the setting of enteritis, clinical correlation was advised, no evidence of enteric obstruction.   EKG done in the Emergency Room showed sinus tachycardia with premature atrial complexes at rate of 117 beats per minute, right bundle branch block, left anterior fascicular block, LVH with early repolarization abnormalities, QRS widening, questionable septal infarct age undetermined, possible lateral infarct age undetermined, nonspecific ST-T changes. No significant change since prior EKG done on 10/23/2014.   ASSESSMENT AND PLAN:  1.  Acute likely infectious gastroenteritis.  Admit the patient to medical floor. Start her on Cipro as well as Flagyl.  Get stool cultures for Clostridium difficile as well as comprehensive cultures. Continue the patient on PPIs and hold her Aleve as well as aspirin therapy for now. Also add hyoscyamine for abdominal cramping.  2.  Hypokalemia. Supplement IV. Get magnesium level.  3.  Rectal bleeding, seems to be subsiding. We will give also the patient some Anusol suppositories if needed.  4.  Severe malnutrition, protein and calorie. The patient will be initiated on Boost Breeze for now, to be advanced as tolerated.  We will also get dietitian involved for further recommendations.  5.  Tachycardia, sinus tachycardia, likely due to low intravascular volume, volume depletion. We will continue the patient on IV fluids and will follow patient's heart rate.   TIME SPENT: 40 minutes.     ____________________________ Katharina Caper, MD rv:bu D: 11/01/2014 19:44:40 ET T: 11/01/2014 20:11:09 ET JOB#: [id number]  cc: Katharina Caper, MD, <Dictator> Marisue Ivan, MD Arshia Rondon MD ELECTRONICALLY SIGNED 11/10/2014 20:17

## 2014-11-11 NOTE — Consult Note (Signed)
Brief Consult Note: Diagnosis: diarrhea with bloating, gas, intermittent episodes nausea/vomiting, etiology to consider diverticulitis, infection, C difficele, ischemia.   Patient was seen by consultant.   Consult note dictated.   Comments: Continue on Cipro/Flagyl and monitor labs and clinical course. She is not an ideal luminal candidate given advanced age and co morbidites. No abominal tenderness on exam. Xray showed some stool in the vault-no obstipation-doubt overflow diarrhea. She is hungry and eating clear liquid diet. History of CVA and dysarthria. She is coughing after eating-consider need for a repeat MBSS if persists. Family reports she is at baseline. This case was discussed wiht Dr. Mechele CollinElliott in collabortaion of care.  Electronic Signatures: Rowan BlaseMills, Nazli Penn Ann (NP)  (Signed 22-Apr-16 16:14)  Authored: Brief Consult Note   Last Updated: 22-Apr-16 16:14 by Rowan BlaseMills, Aya Geisel Ann (NP)

## 2014-11-11 NOTE — Consult Note (Signed)
PATIENT NAME:  Anna [name], Anna [name] MR#:  [id number] DATE OF BIRTH:  1921/07/21  DATE OF CONSULTATION:  11/02/2014  REFERRING PHYSICIAN:  Theodoro Grist, MD CONSULTING PHYSICIAN:  Manya Silvas, MD/Mileena Rothenberger A. Jerelene Redden, ANP (Adult Nurse Practitioner)  REASON FOR CONSULTATION: Nausea, vomiting, diarrhea with belching and x-ray show moderate colon stool burden.   HISTORY OF PRESENT ILLNESS: This 81 year old Caucasian female with history of recent admission 10/23/2014 through 10/26/2014 has had problems with recurrent episodes of nausea, vomiting and diarrhea. She was seen in the hospital by Dr. Thurmond Butts last week, her rectal bleeding subsided. She was discharged home on Cipro without radiologic studies performed while in the hospital.   She came back to the Emergency Room, reporting persistent symptoms and says she went home too soon and just never really got better. Her daughter had to hold her Cipro 2 days ago because of stomach issues. The patient does report that she has watery stools up to 6 times per day. She gets up at night some, but mostly passes gas. May have some stool at night. They have noted a little blood in the stool. Blood was noted in the ER. The patient has vague sense of nausea, and these symptoms have been present over the last 2 weeks. She has denied weight loss, hematemesis, fevers or chills and abdominal pain. She does report her stomach feels sore and bloated. She is a very hungry now, eating clear liquid diet.   The patient is coughing after swallowing. Her daughter reports this is her baseline. She did have a modified barium swallow study last year and was told that as long she coughed and cleared her secretions, she was not in danger of aspiration. According to the daughter, the patient has not experienced aspiration pneumonia. The patient denies dysphagia, but she does have belching.   PAST MEDICAL HISTORY:  1. Admission for rectal bleed and diarrhea, dehydration, urinary tract  infection 10/23/2014 through 10/26/2014.  2. Hypertension.  3. Hyperlipidemia.  4. History of stroke with right-sided paralysis 08/17/2013. She finished rehab December 2015. She lives at home with a caregiver and does get exercise walking in the house with a walker and a belt. She has transport chair and out of the bed and bathroom.  5. Colonoscopy in 2005 showed large diverticulum in the sigmoid colon, descending colon and transverse colon. This was secondary to a lower GI bleed.   PAST SURGICAL HISTORY:  1. Cholecystectomy.  2. Appendectomy.   FAMILY HISTORY: Father had pancreatic cancer. Mother lived greater than 100 years.   REVIEW OF SYSTEMS: The patient says she does not want to answer any more questions. History obtained through the chart review with positive abdominal discomfort, nausea, vomiting, diarrhea, cramping in the abdomen gas and bloating. She also admits to fatigue and weakness, no weight loss. Her appetite is good. There have been reports of blood in the Emergency Room.  The rest of the remaining review of systems cannot be obtained.   PHYSICAL EXAMINATION:  VITAL SIGNS: 97.7, 73, 14, 125/75, pulse oximetry on room air is 97%.  GENERAL: Well-developed Caucasian female sitting in bed. She is visiting with her 2 sisters and her daughter. She is trying to eat a clear liquid lunch and she is very hungry.  She has repetitive coughing after swallowing. She cannot catch her breath. She has a little hoarseness, and then she recovers and eats again.  HEENT: Head is normocephalic. Conjunctivae pink. Sclerae anicteric. Oral mucosa is moist, intact.  NECK: Supple. She  does have dysarthria to the speech, consistent with her stroke history.  LUNGS: CTA. Respirations are nonlabored. There is no wheezing or rhonchi.  CARDIOVASCULAR: S1, S2 without murmur or gallop.  ABDOMEN: Soft, nontender.  RECTAL: Deferred. It was performed in the Emergency Room with heme-positive stool.   MUSCULOSKELETAL: Movement of left side, positive right hemiparesis. The patient has dysarthria noted. When she talks she points her finger with her left hand. Family reports limited mobility. See history of present illness.  SKIN: Anteriorly without evidence of rash or breakdown.  NEUROLOGIC: See history above. Positive dysarthria noted.  PSYCHIATRIC: The patient is irritable, declining further questioning, desires to eat instead.   LABORATORY DATA: Admission blood work 11/01/2014 with unremarkable MET-B, except for potassium 3.3. Calcium 7.6, low lipase 23, magnesium 1.7. Albumin is 2.3. Liver panel unremarkable, except for ALT low at 9, troponin negative x3. WBC normal at 6.8 to 4.1, hemoglobin 11.5 to 10.5, platelet count normal, MCV low 78. Pro time 14.6, INR 1.1, amber-clear yellow urine.   RADIOLOGY: Three-way abdomen including PA of the chest shows atherosclerosis, moderate colonic stool burden without evidence of enteric obstruction, potential mild bowel wall thickening involving the transverse and descending colon, potentially artifactual due to under distention. There is post cholecystectomy, several phleboliths noted in the pelvis, osteopenia with moderate scoliotic curvature of the thoracolumbar spine, possibly positional, no evidence of enteric obstruction.   IMPRESSION: A 81 year old female patient recently discharged from the hospital 1 week, again readmitted for abdominal pain, nausea, vomiting, diarrhea with belching and gassy abdomen. She was Hemoccult positive in the ER. Her most recent colonoscopy was over 10 years ago. The patient with history of stroke in February 2015 with right-sided paralysis. She has coughing and choking with swallowing clear liquids and family reports this is her baseline with history of modified barium swallow study 1 year ago. Her 3-way abdomen showed moderate colonic stool burden with potential bowel wall thickening involving the transverse descending colon,  possibly artifactual. No airspace opacity is noted on the PA chest x-ray portion. No evidence of diverticulitis per her CT study last week.   PLAN:  1. The patient is on Cipro and Flagyl at this time. She is in contact isolation with orders for Clostridium difficile, comprehensive culture, which have not been obtained. Recommend stool studies.  2. The abdominal x-ray suggests she has moderate stool burden, which raises the possibility of overflow diarrhea and abdominal complaints secondary to constipation.  3. Heme-positive stool noted, with drift in hemoglobin from baseline of 12 range to 10.5, normal BUN and vital signs are normal. These findings do not suggest acute aggressive gastrointestinal bleed. This patient has some degree of risk with advanced age for luminal evaluation.  4. She has a history of stroke, right-sided deficits, arthralgia noted.  5. She has dysarthria noted with speech, which family can understand her well. They report this is baseline. She has coughing and choking with swallowing and they report that is normal as well, and we are told after her modified barium swallow study last year that as long as she coughed, she was clearing her secretions and not at risk for aspiration pneumonia.  6. History of remote lower gastrointestinal bleed 2005, with colonoscopy showing extensive diverticulosis.   PLAN:  1. Etiology of her heme-positive stool could certainly be irritation from repetitive wiping from diarrhea, could be diverticular in nature. The diarrhea could stem from antibiotic exposure and recommend following through with a Clostridium difficile and comprehensive culture studies.  2.  I will discuss this case with Dr. Vira Agar regarding need to continue with Cipro and Flagyl.  3. Recommend repeat modified barium swallow.  4. Further GI recommendations pending.   Thank you for this consultation.   ____________________________ Janalyn Harder Jerelene Redden, ANP (Adult Nurse  Practitioner) kam:AT D: 11/02/2014 15:23:21 ET T: 11/02/2014 15:39:51 ET JOB#: [id number]  cc: Joelene Millin A. Jerelene Redden, ANP (Adult Nurse Practitioner), <Dictator> Janalyn Harder Sherlyn Hay, MSN, ANP-BC Adult Nurse Practitioner ELECTRONICALLY SIGNED 11/05/2014 9:24

## 2014-11-11 NOTE — H&P (Signed)
PATIENT NAME:  Anna MullDAVIS, [name] MR#:  [id number] DATE OF BIRTH:  1921-12-03  DATE OF ADMISSION:  10/23/2014  PRIMARY DOCTOR:  Dr. Burnadette PopLinthavong.   EMERGENCY ROOM PHYSICIAN:DR.Quale CHIEF COMPLAINT: Rectal bleeding.   HISTORY OF PRESENT ILLNESS: A 81 year old female patient with history of stroke and residual right side deficit and slurred speech, brought in by family because of rectal bleed. The patient has been having some diarrhea last week on Saturday and then since yesterday morning she is having bright red blood from rectum. The patient two episodes of gross rectal bleeding since yesterday morning. Has some lower abdominal cramps, but has no other complaints like no nausea, no vomiting, no diarrhea.   She has no constipation on a regular basis.    PAST MEDICAL HISTORY: Positive history for diverticulosis and has been admitted 2 times for diverticular bleed before. Past medical history is significant for hypertension, history of previous stroke and residual right deficit, last colonoscopy was 2005 which showed large diverticula in sigmoid colon, descending colon, and transverse colon, the patient had a colonoscopy done by Dr. Mechele CollinElliott in 2005.    ALLERGIES: She has no known allergies.   MEDICATIONS: Aspirin 325 mg p.o. daily, lisinopril 2.5 mg p.o. daily, metoprolol tartrate 25 mg half tablet p.o. b.i.d., Protonix 40 mg p.o. daily, simvastatin 20 mg p.o. daily, Singulair 10 mg p.o. daily, Zyrtec 10 mg p.o. daily, Aleve 220 mg p.o. daily.   SOCIAL HISTORY: No smoking, no drinking, no drugs.   PAST SURGICAL HISTORY:  Significant for cholecystectomy, appendectomy.   FAMILY HISTORY:  Mother lived over 100 years and father had pancreatic cancer.  REVIEW OF SYSTEMS:  CONSTITUTIONAL:  The patient denies any fever or fatigue.  EYES: No blurred vision.  EARS, NOSE, AND THROAT: No tinnitus. No epistaxis. No difficulty swallowing. The patient has slurred speech secondary to stroke.  CARDIOVASCULAR: No  chest pain, orthopnea.  No PND.  GASTROINTESTINAL: Has no nausea. No vomiting. No diarrhea. Has mild (generalized abdominal discomfort.no rebound tenderness..  GENITOURINARY: No dysuria or hematuria.  ENDOCRINE: No polyuria or nocturia.  INTEGUMENTARY: No skin rashes.  MUSCULOSKELETAL: The patient has no joint pain.  NEUROLOGIC: Weak on the right side due to previous stroke and has dysarthria.  PSYCHIATRIC: No anxiety or insomnia.   PHYSICAL EXAMINATION:   VITAL SIGNS: Temperature 97.9, heart rate 120, blood pressure 122/72, saturation is 94% on room air.  GENERAL: Alert, awake, and oriented 81 year old elderly female with slurred speech due to previous stroke.  HEENT: Head is atraumatic, normocephalic.  EYES: Pupils equal, reacting to light. No scleral icterus.  EARS: No drainage. No lesion.  NOSE: No turbinate hypertrophy.  THROAT: No oropharyngeal erythema.  NECK: Supple. No JVD. No carotid bruit.   CHEST:  No chest wall tenderness.  LUNGS: Bilaterally clear to auscultation. No wheeze. No rales.  CARDIOVASCULAR: S1, S2 regular. No murmurs. Good pedal pulse, femoral pulse.  ABDOMEN: Soft, nontender, nondistended. Bowel sounds present. Guaiac-positive stools.  MUSCULOSKELETAL: 5/5 strength in the lower extremities and upper extremity on the left side, strength is diminished on the right side due to previous stroke.  SKIN: Inspection shows no nodules. No induration. No erythema.  LYMPHATIC:  No lymphadenopathy.  NEUROLOGIC: The patient's cranial nerves intact. The patient has dysarthria and right-sided weakness due to previous stroke. Power is 5 out of 5 day on the left upper and lower extremities.  PSYCHIATRIC: Mood and affect are within normal limits.   LABORATORY DATA:  1.  UA is amber cloudy  urine, bacteria few.  2.  WBC 6.1, hemoglobin 12.6, hematocrit 40.5, platelets 256,000.  3.  Electrolytes, sodium 135, potassium 3.3, chloride 102, bicarbonate 27, BUN 21, creatinine 0.82,  glucose 96.  4.  EKG shows heart rate 125 with sinus tachycardia with fusion beats present, incomplete RBB.   ASSESSMENT AND PLAN:   1.  The patient is a 81 year old female with rectal bleed, likely has diverticular bleed. The patient is admitted to hospitalist service, is hemodynamically stable with stable blood pressure and stable hemoglobin. Admit to off unit telemetry. Check CBC q. 6 hours, and threshold for transfusion will be around 7, and continue IV fluids, obtain GI consult. She is on aspirin and Aleve, hold them and continue Protonix, ( 2.  Malignant hypertension;. Use IV labetalol 20 mg q. 6 hours for blood pressure more than 170/90, heart rate more than 110. 3.  Previous stroke with right side deficit. The patient's aspirin and statins are stopped at this time secondary to GI bleed.  4.  Code status:  Full code.    TIME SPENT: 55 minutes.     ____________________________ Katha Hamming, MD sk:bu D: 10/23/2014 12:52:29 ET T: 10/23/2014 13:54:00 ET JOB#: [id number]  cc: Katha Hamming, MD, <Dictator> Katha Hamming MD ELECTRONICALLY SIGNED 10/24/2014 15:12

## 2014-11-11 NOTE — Consult Note (Signed)
Brief Consult Note: Diagnosis: rectal bleeding.   Patient was seen by consultant.   Consult note dictated.   Comments: Appreciate consult for 81y/o caucasian woman w hx cva 6687yr ago, diverticula, htn, admitted with intermittent bright red rectal bleeding for evaluation of same. Daughters aid with history: patient with significant dysarthria since CVA and right sided weakness. They report patient has has some loose stools since Sunday, without bleeding intially. Did note some brpr since yesterday, initially small, but then had a larger amount today- still bright red in nature.State there has been no abdominal pain, NV, fever, dyspepsia or other GI related complaints. Had colonoscopy 2005 with diverticula and hemorrhoids but no polyps. They report she follows a bland soft diet consisting of mostly chicken, eggs,  and potatos. Hgb normal. hemodynamically stable. abd nd/nt, soft. Rectal exam with tan effluent on finger and soft irregularities consistent with internal hemorrhoids Perianal skin tight white shiny consistent with lichen sclerosis/planus.  No unusual masses.  Impression/plan: brpr with diarrhea. suspect anal outlet- do recommend stool studies for now. Looks also as patient has 3+ blood in urine with leukocytes- so question if she has UTI and if hematuria could be a consideration at this point. Will discuss further with Dr Shelle Ironein.  Electronic Signatures: Vevelyn PatLondon, Nevin Grizzle H (NP)  (Signed 12-Apr-16 16:23)  Authored: Brief Consult Note   Last Updated: 12-Apr-16 16:23 by Keturah BarreLondon, Jerrit Horen H (NP)

## 2014-11-11 NOTE — Consult Note (Signed)
Details:   - GI Note:  No further bleeding.  Hgb essentially stable.  Safe for d/c from GI standpoint.   Electronic Signatures: Dow Adolphein, Draxton Luu (MD)  (Signed 14-Apr-16 18:36)  Authored: Details   Last Updated: 14-Apr-16 18:36 by Dow Adolphein, Fannye Myer (MD)

## 2014-11-11 NOTE — Discharge Summary (Signed)
PATIENT NAME:  Anna [name], Anna [name] MR#:  [id number] DATE OF BIRTH:  26-Oct-1921  DATE OF ADMISSION:  10/23/2014 DATE OF DISCHARGE:  10/26/2014  DISCHARGE DIAGNOSES: 1. Bright red blood per rectum, likely hemorrhoidal or diverticular bleed.  2. Acute blood loss anemia.  3. Diarrhea.  4. Dehydration.  5. Hypertension.  6. History of cerebrovascular accident.  7. Urinary tract infection.   DISCHARGE MEDICATIONS: Please refer to medicine reconciliation.   DISCHARGE INSTRUCTIONS: Home health with PT set up. Low sodium diet. Activity as tolerated.  Follow up with primary care physician in 1-2 weeks.   CONSULTS:  Dow AdolphMatthew Rein, MD, with gastroenterology.   ADMITTING HISTORY AND PHYSICAL AND HOSPITAL COURSE: Please see detailed H and P dictated previously. In brief, a 81 year old female patient, was admitted to the hospitalist service after found to have some rectal bleeding.  1. The patient was admitted onto the medical floor and monitored closely. Colonoscopy was deferred by GI. By day of discharge, the patient's bleeding has improved significantly. This is thought to be likely hemorrhoidal or diverticular.   The patient has had 2 colonoscopies in the past which showed only diverticulosis, nothing acute. At this point, the patient is doing well. Hemoglobin is stable. She likely will have further minimal bleeding for the next few days, cleared for discharge by GI. She did have some episodes of loose stools for which Clostridium difficile was checked, which was negative. At this point, she is being started on Imodium p.r.n.  2. Dehydration resolved with IV fluids.  3. CVA and hypertension, stable.  4. UTI:  The patient had suprapubic pain but no fever or white count, but due to her symptoms, she is being started on ciprofloxacin for 4 days after discharge.   Prior to discharge, the patient's lungs sound clear. S1, S2 heard.  Abdomen nontender. Mild suprapubic tenderness. No CVA tenderness.  Has chronic  right-sided weakness from  stroke.   TIME SPENT: ON THE DAY OF DISCHARGE IN DISCHARGE ACTIVITY: Was 40 minutes.   ____________________________ Molinda BailiffSrikar R. Raelin Pixler, MD srs:tr D: 10/26/2014 16:04:15 ET T: 10/26/2014 16:49:16 ET JOB#: [id number]  cc: Wardell HeathSrikar R. Elpidio AnisSudini, MD, <Dictator> Marisue IvanKanhka Linthavong, MD Orie FishermanSRIKAR R Baudelia Schroepfer MD ELECTRONICALLY SIGNED 11/05/2014 11:19

## 2014-11-11 NOTE — Consult Note (Signed)
Pt without diarrhea, doing better, eating some, not as strong as was, agree with rehab for a few weeks.  I will sign off.    Electronic Signatures: Scot JunElliott, Robert T (MD)  (Signed on 25-Apr-16 18:06)  Authored  Last Updated: 25-Apr-16 18:06 by Scot JunElliott, Robert T (MD)

## 2014-11-11 NOTE — Consult Note (Signed)
PATIENT NAME:  Anna [name], Anna [name] MR#:  [id number] DATE OF BIRTH:  15-Apr-1922  DATE OF CONSULTATION:  10/23/2014  REASON FOR CONSULTATION: GI consult ordered by Dr. Luberta Mutter for evaluation of rectal bleeding.   HISTORY OF PRESENT ILLNESS: I appreciate consult for this 81 year old Caucasian woman with history of CVA 1 year ago, diverticula, hypertension, admitted with intermittent bright red rectal bleeding for evaluation of same. Daughters aid with her history, as the patient has significant dysarthria and right-sided weakness since her CVA. They report the patient has had some loose stools since Sunday and without bleeding initially. Did note some small amounts of BRPR yesterday, but then had a larger amount today, still bright red in nature. States this been no abdominal pain, nausea, vomiting, fever, dyspepsia, or other GI-related complaints. Did have colonoscopy in 2005 with diverticula and hemorrhoids, but no polyps. They report she follows a bland, soft diet consisting of mostly chicken eggs and potatoes. Her hemoglobin is normal. She is hemodynamically stable.    PAST MEDICAL HISTORY: Diverticulosis. Has had two prior episodes of diverticular bleeding, hypertension, prior stroke with right residual deficit and dysarthria.   ALLERGIES: NO KNOWN ALLERGIES.   HOME MEDICATIONS: ASA 325 p.o. daily, lisinopril 2.5 p.o. daily, metoprolol tartrate 25 mg p.o. 1/2 tablet b.i.d., Protonix 40 mg once day, simvastatin 20 mg once a day, Singular 10 mg once a day, Zyrtec 10 mg once a day, Aleve 220 mg p.o. once a day p.r.n.   SOCIAL HISTORY: Lives with daughters. Did spend a few months at [facility] after her stroke last year. No tobacco, EtOH or illicits.   PAST SURGICAL HISTORY: Cholecystectomy, appendectomy.   FAMILY HISTORY: Significant for pancreatic cancer.   REVIEW OF SYSTEMS: Ten systems reviewed, unremarkable other than what is noted above.   LABORATORY DATA: Most recent labs: Glucose 96, BUN  21, creatinine 0.82, sodium 135, potassium 3.3, GFR greater than 60, calcium 8.1, total protein 5.8, albumin 2.8, total bilirubin 1.1, ALP 80, AST 19, ALT 11. WBC 6.1, hemoglobin 12.6, hematocrit 40.5, platelet count 256. Red cells, microcytic. I do note 3+ blood in urine, leukocytes, ketones.   PHYSICAL EXAMINATION:  VITAL SIGNS: Most recent: Temperature 98.1, pulse 109, respiratory rate 18, blood pressure 136/81, SAO2 94% on room air.  GENERAL: Alert, pleasant at the chronically ill, but well-appearing elderly woman in no acute distress.  HEENT: Normocephalic, atraumatic. Conjunctivae pink.  NECK: Supple. No JVD or thyromegaly.  CHEST: Respirations eupneic. Lungs clear.  CARDIAC: S1, S2, RRR. No MRG. No edema.  ABDOMEN: Nontender, nondistended. Bowel sounds x4. Soft. No guarding, rebound tenderness, or rigidity. No hepatosplenomegaly or masses.  RECTAL: There is a shiny, taut, pearly white skin around the anus into the perianal area, There are no other obvious external abnormalities in the anal canal. There are a few small soft oblong areas consistent with internal hemorrhoids. The effluent on my finger is tan.  EXTREMITIES: Right-sided weakness consistent with her history of stroke. Strength 5/5 to the left side. Sensation appears to be intact.  SKIN: Warm, dry, pink. No erythema, lesion or rash.  NEUROLOGIC: Alert, she appears to be and oriented although she has dysarthria and it is somewhat difficult to understand her. Otherwise, her cranial nerves appear to be intact.  PSYCHIATRIC: Pleasant, calm, cooperative.   IMPRESSION AND PLAN: Bright red bleeding per rectum with diarrhea, suspect anal outlet. Do recommend stool studies for now. It looks as also the patient has 3+ blood in her urine with leukocytes, so a  question if she has a urinary tract infection and if hematuria could be a consideration at this point. We will follow with you.   Thank you very much for this consult. These services  were provided by Vevelyn Pathristiane Keni Wafer, MSN, [facility], in collaboration with Dr. Alycia Rossettiyan, with whom I have discussed this patient in full.  ____________________________ Anna Barrehristiane H. Jakira Mcfadden, NP chl:AT D: 10/23/2014 17:44:09 ET T: 10/23/2014 17:57:23 ET JOB#: [id number]  cc: Anna Barrehristiane H. Jobie Popp, NP, <Dictator> Anna MaizeHRISTIANE H Charee Tumblin FNP ELECTRONICALLY SIGNED 10/24/2014 9:26

## 2015-05-15 ENCOUNTER — Emergency Department: Payer: Medicare Other

## 2015-05-15 ENCOUNTER — Inpatient Hospital Stay
Admission: EM | Admit: 2015-05-15 | Discharge: 2015-05-20 | DRG: 392 | Disposition: A | Discharge status: Skilled Nursing Facility | Payer: Medicare Other | Attending: Internal Medicine | Admitting: Internal Medicine

## 2015-05-15 ENCOUNTER — Encounter: Payer: Self-pay | Admitting: Emergency Medicine

## 2015-05-15 DIAGNOSIS — Z791 Long term (current) use of non-steroidal anti-inflammatories (NSAID): Secondary | ICD-10-CM

## 2015-05-15 DIAGNOSIS — Z881 Allergy status to other antibiotic agents status: Secondary | ICD-10-CM | POA: Diagnosis not present

## 2015-05-15 DIAGNOSIS — I639 Cerebral infarction, unspecified: Secondary | ICD-10-CM

## 2015-05-15 DIAGNOSIS — K5732 Diverticulitis of large intestine without perforation or abscess without bleeding: Secondary | ICD-10-CM | POA: Diagnosis present

## 2015-05-15 DIAGNOSIS — N390 Urinary tract infection, site not specified: Secondary | ICD-10-CM | POA: Diagnosis present

## 2015-05-15 DIAGNOSIS — M179 Osteoarthritis of knee, unspecified: Secondary | ICD-10-CM | POA: Diagnosis present

## 2015-05-15 DIAGNOSIS — I10 Essential (primary) hypertension: Secondary | ICD-10-CM | POA: Diagnosis present

## 2015-05-15 DIAGNOSIS — E785 Hyperlipidemia, unspecified: Secondary | ICD-10-CM | POA: Diagnosis present

## 2015-05-15 DIAGNOSIS — Z87440 Personal history of urinary (tract) infections: Secondary | ICD-10-CM

## 2015-05-15 DIAGNOSIS — K219 Gastro-esophageal reflux disease without esophagitis: Secondary | ICD-10-CM | POA: Diagnosis present

## 2015-05-15 DIAGNOSIS — I69351 Hemiplegia and hemiparesis following cerebral infarction affecting right dominant side: Secondary | ICD-10-CM

## 2015-05-15 DIAGNOSIS — Z7982 Long term (current) use of aspirin: Secondary | ICD-10-CM | POA: Diagnosis not present

## 2015-05-15 DIAGNOSIS — K5792 Diverticulitis of intestine, part unspecified, without perforation or abscess without bleeding: Secondary | ICD-10-CM | POA: Diagnosis present

## 2015-05-15 DIAGNOSIS — Z823 Family history of stroke: Secondary | ICD-10-CM | POA: Diagnosis not present

## 2015-05-15 DIAGNOSIS — I69320 Aphasia following cerebral infarction: Secondary | ICD-10-CM | POA: Diagnosis not present

## 2015-05-15 DIAGNOSIS — Z800 Family history of malignant neoplasm of digestive organs: Secondary | ICD-10-CM | POA: Diagnosis not present

## 2015-05-15 DIAGNOSIS — R197 Diarrhea, unspecified: Secondary | ICD-10-CM

## 2015-05-15 HISTORY — DX: Cerebral infarction, unspecified: I63.9

## 2015-05-15 LAB — COMPREHENSIVE METABOLIC PANEL
ALBUMIN: 3.1 g/dL — AB (ref 3.5–5.0)
ALT: 7 U/L — AB (ref 14–54)
AST: 14 U/L — ABNORMAL LOW (ref 15–41)
Alkaline Phosphatase: 75 U/L (ref 38–126)
Anion gap: 12 (ref 5–15)
BUN: 24 mg/dL — AB (ref 6–20)
CALCIUM: 8.8 mg/dL — AB (ref 8.9–10.3)
CHLORIDE: 103 mmol/L (ref 101–111)
CO2: 25 mmol/L (ref 22–32)
Creatinine, Ser: 0.95 mg/dL (ref 0.44–1.00)
GFR calc non Af Amer: 51 mL/min — ABNORMAL LOW (ref >60)
GFR, EST AFRICAN AMERICAN: 59 mL/min — AB (ref >60)
GLUCOSE: 116 mg/dL — AB (ref 65–99)
Potassium: 3.1 mmol/L — ABNORMAL LOW (ref 3.5–5.1)
Sodium: 140 mmol/L (ref 135–145)
Total Bilirubin: 0.8 mg/dL (ref 0.3–1.2)
Total Protein: 6.8 g/dL (ref 6.5–8.1)

## 2015-05-15 LAB — CBC WITH DIFFERENTIAL/PLATELET
Basophils Absolute: 0 10*3/uL (ref 0–0.1)
Basophils Relative: 0 %
Eosinophils Absolute: 0.1 10*3/uL (ref 0–0.7)
Eosinophils Relative: 2 %
HCT: 40.7 % (ref 35.0–47.0)
HEMOGLOBIN: 12.7 g/dL (ref 12.0–16.0)
LYMPHS ABS: 2.7 10*3/uL (ref 1.0–3.6)
Lymphocytes Relative: 30 %
MCH: 22.8 pg — AB (ref 26.0–34.0)
MCHC: 31.3 g/dL — AB (ref 32.0–36.0)
MCV: 73 fL — ABNORMAL LOW (ref 80.0–100.0)
MONOS PCT: 10 %
Monocytes Absolute: 0.9 10*3/uL (ref 0.2–0.9)
Neutro Abs: 5.3 10*3/uL (ref 1.4–6.5)
Neutrophils Relative %: 58 %
Platelets: 319 10*3/uL (ref 150–440)
RBC: 5.58 MIL/uL — ABNORMAL HIGH (ref 3.80–5.20)
RDW: 17.7 % — AB (ref 11.5–14.5)
WBC: 9.1 10*3/uL (ref 3.6–11.0)

## 2015-05-15 LAB — TROPONIN I

## 2015-05-15 LAB — TSH: TSH: 2.283 u[IU]/mL (ref 0.350–4.500)

## 2015-05-15 MED ORDER — PANTOPRAZOLE SODIUM 40 MG PO TBEC
40.0000 mg | DELAYED_RELEASE_TABLET | Freq: Every day | ORAL | Status: DC
  Administered 2015-05-16 – 2015-05-20 (×5): 40 mg via ORAL
  Filled 2015-05-15 (×5): qty 1

## 2015-05-15 MED ORDER — CIPROFLOXACIN IN D5W 400 MG/200ML IV SOLN
400.0000 mg | Freq: Once | INTRAVENOUS | Status: AC
  Administered 2015-05-15: 400 mg via INTRAVENOUS
  Filled 2015-05-15: qty 200

## 2015-05-15 MED ORDER — ONDANSETRON HCL 4 MG PO TABS
4.0000 mg | ORAL_TABLET | Freq: Four times a day (QID) | ORAL | Status: DC | PRN
  Administered 2015-05-16: 4 mg via ORAL
  Filled 2015-05-15: qty 1

## 2015-05-15 MED ORDER — MONTELUKAST SODIUM 10 MG PO TABS
10.0000 mg | ORAL_TABLET | Freq: Every day | ORAL | Status: DC
  Administered 2015-05-16 – 2015-05-20 (×5): 10 mg via ORAL
  Filled 2015-05-15 (×5): qty 1

## 2015-05-15 MED ORDER — ACETAMINOPHEN 325 MG PO TABS: 650.0000 mg | ORAL_TABLET | Freq: Four times a day (QID) | ORAL | Status: DC | PRN

## 2015-05-15 MED ORDER — METRONIDAZOLE IN NACL 5-0.79 MG/ML-% IV SOLN
500.0000 mg | Freq: Once | INTRAVENOUS | Status: AC
  Administered 2015-05-15: 500 mg via INTRAVENOUS
  Filled 2015-05-15: qty 100

## 2015-05-15 MED ORDER — SODIUM CHLORIDE 0.9 % IV BOLUS (SEPSIS)
1000.0000 mL | Freq: Once | INTRAVENOUS | Status: AC
  Administered 2015-05-15: 1000 mL via INTRAVENOUS

## 2015-05-15 MED ORDER — ASPIRIN 81 MG PO CHEW
81.0000 mg | CHEWABLE_TABLET | Freq: Every day | ORAL | Status: DC
  Administered 2015-05-15 – 2015-05-20 (×6): 81 mg via ORAL
  Filled 2015-05-15 (×6): qty 1

## 2015-05-15 MED ORDER — ACETAMINOPHEN 650 MG RE SUPP: 650.0000 mg | Freq: Four times a day (QID) | RECTAL | Status: DC | PRN

## 2015-05-15 MED ORDER — SIMVASTATIN 10 MG PO TABS
10.0000 mg | ORAL_TABLET | Freq: Every day | ORAL | Status: DC
  Administered 2015-05-15 – 2015-05-18 (×4): 10 mg via ORAL
  Filled 2015-05-15 (×5): qty 1

## 2015-05-15 MED ORDER — LORATADINE 10 MG PO TABS
10.0000 mg | ORAL_TABLET | Freq: Every day | ORAL | Status: DC
  Administered 2015-05-16 – 2015-05-20 (×5): 10 mg via ORAL
  Filled 2015-05-15 (×5): qty 1

## 2015-05-15 MED ORDER — NAPROXEN 250 MG PO TABS
250.0000 mg | ORAL_TABLET | Freq: Two times a day (BID) | ORAL | Status: DC
  Administered 2015-05-15: 250 mg via ORAL
  Filled 2015-05-15 (×4): qty 1

## 2015-05-15 MED ORDER — NYSTATIN 100000 UNIT/GM EX CREA
1.0000 "application " | TOPICAL_CREAM | Freq: Three times a day (TID) | CUTANEOUS | Status: DC | PRN
  Administered 2015-05-16: 1 via TOPICAL
  Filled 2015-05-15: qty 15

## 2015-05-15 MED ORDER — AMOXICILLIN-POT CLAVULANATE 875-125 MG PO TABS: 1.0000 | ORAL_TABLET | Freq: Two times a day (BID) | ORAL | Status: DC

## 2015-05-15 MED ORDER — IOHEXOL 240 MG/ML SOLN
25.0000 mL | Freq: Once | INTRAMUSCULAR | Status: AC | PRN
  Administered 2015-05-15: 25 mL via ORAL

## 2015-05-15 MED ORDER — METOPROLOL TARTRATE 25 MG PO TABS
12.5000 mg | ORAL_TABLET | Freq: Two times a day (BID) | ORAL | Status: DC
  Administered 2015-05-15 – 2015-05-20 (×9): 12.5 mg via ORAL
  Filled 2015-05-15 (×11): qty 1

## 2015-05-15 MED ORDER — SODIUM CHLORIDE 0.9 % IV SOLN
3.0000 g | Freq: Four times a day (QID) | INTRAVENOUS | Status: DC
  Administered 2015-05-16 – 2015-05-17 (×7): 3 g via INTRAVENOUS
  Filled 2015-05-15 (×10): qty 3

## 2015-05-15 MED ORDER — ONDANSETRON HCL 4 MG/2ML IJ SOLN
4.0000 mg | Freq: Once | INTRAMUSCULAR | Status: AC
  Administered 2015-05-15: 4 mg via INTRAVENOUS

## 2015-05-15 MED ORDER — IOHEXOL 300 MG/ML  SOLN
75.0000 mL | Freq: Once | INTRAMUSCULAR | Status: AC | PRN
  Administered 2015-05-15: 75 mL via INTRAVENOUS

## 2015-05-15 MED ORDER — ONDANSETRON HCL 4 MG/2ML IJ SOLN
INTRAMUSCULAR | Status: AC
  Administered 2015-05-15: 4 mg via INTRAVENOUS
  Filled 2015-05-15: qty 2

## 2015-05-15 MED ORDER — KETOROLAC TROMETHAMINE 30 MG/ML IJ SOLN
INTRAMUSCULAR | Status: DC
Start: 2015-05-15 — End: 2015-05-15
  Filled 2015-05-15: qty 1

## 2015-05-15 MED ORDER — POTASSIUM CHLORIDE IN NACL 40-0.9 MEQ/L-% IV SOLN
INTRAVENOUS | Status: DC
  Administered 2015-05-15 – 2015-05-20 (×10): 100 mL/h via INTRAVENOUS
  Filled 2015-05-15 (×14): qty 1000

## 2015-05-15 MED ORDER — INFLUENZA VAC SPLIT QUAD 0.5 ML IM SUSY
0.5000 mL | PREFILLED_SYRINGE | INTRAMUSCULAR | Status: AC
  Administered 2015-05-18: 0.5 mL via INTRAMUSCULAR
  Filled 2015-05-15: qty 0.5

## 2015-05-15 MED ORDER — AMOXICILLIN-POT CLAVULANATE 875-125 MG PO TABS
1.0000 | ORAL_TABLET | Freq: Once | ORAL | Status: DC
  Filled 2015-05-15: qty 1

## 2015-05-15 MED ORDER — ONDANSETRON HCL 4 MG/2ML IJ SOLN: 4.0000 mg | Freq: Four times a day (QID) | INTRAMUSCULAR | Status: DC | PRN

## 2015-05-15 MED ORDER — NAPROXEN SODIUM 220 MG PO TABS
220.0000 mg | ORAL_TABLET | Freq: Two times a day (BID) | ORAL | Status: DC
Start: 2015-05-15 — End: 2015-05-15

## 2015-05-15 NOTE — H&P (Signed)
[facility]   PATIENT NAME: Anna SingletonDoris [name]    MR#:  [id number]  DATE OF BIRTH:  Oct 23, 1921  DATE OF ADMISSION:  05/15/2015  PRIMARY CARE PHYSICIAN: Wynona DoveWALKER,JENNIFER AZBELL, MD   REQUESTING/REFERRING PHYSICIAN: Governor Rooksebecca Lord MD  CHIEF COMPLAINT:   Chief Complaint  Patient presents with  . Weakness    HISTORY OF PRESENT ILLNESS: Anna [name]  is a 81 y.o. female with a known history of Hypertention, gerd, allergy,hyperlipdemia, anxiety, h/o CVA who has not been feeling well for the past few days according to the daughter. Patient started the with diarrhea since last Friday. And has not been eating or drinking anything for the past few days. She has been very weak and has not been acting herself. Patient has not been on any antibiotics recently. She was seen in the [place] to clinic earlier today for these complaints while she was in the office she kind of slumped over heart rate was noted to be elevated in oxygen level was noted to be little low. Patient also was complaining of abdominal pain she was brought to the emergency room and was noted to have some diverticulitis. She did have some bright red blood on Friday but none since. Patient unable to provide any history due to her stroke and current illness being very weak. Her daughter is the one that provides all the history whomthe patient lives with.    PAST MEDICAL HISTORY:   Past Medical History  Diagnosis Date  . Hypertension   . GERD (gastroesophageal reflux disease)   . Allergy   . Hyperlipidemia   . Anxiety   . Osteoporosis   . Arthritis     DEGENERATIVE; AFFECTING R KNEE  . Palpitations   . Lower GI bleed 2005 2009    SECONDARY TO DIVERTICULOSIS  . Hyperglycemia   . Recurrent UTI   . Recurrent aspiration bronchitis/pneumonia (HCC)   . Diverticulosis   . Bladder prolapse     PAST SURGICAL HISTORY:  Past Surgical History  Procedure Laterality Date  . Cholecystectomy     . Abdominal hysterectomy    . Hemorroidectomy      SOCIAL HISTORY:  Social History  Substance Use Topics  . Smoking status: Never Smoker   . Smokeless tobacco: Never Used  . Alcohol Use: No    FAMILY HISTORY:  Family History  Problem Relation Age of Onset  . Mental illness Mother     DEMENTIA  . Stroke Mother     MINI STROKES  . Cancer Father     PANCREATIC  . Cancer Sister     STOMACH    DRUG ALLERGIES:  Allergies  Allergen Reactions  . Ciprofloxacin Nausea And Vomiting  . Macrobid [Nitrofurantoin Monohydrate Macrocrystals] Nausea And Vomiting    REVIEW OF SYSTEMS:   Unable to provide due to patient being a very poor historian and CVA as well as generalized weakness   MEDICATIONS AT HOME:  Prior to Admission medications   Medication Sig Start Date End Date Taking? Authorizing Provider  aspirin EC 81 MG tablet Take 81 mg by mouth daily.     Yes Historical Provider, MD  cetirizine (ZYRTEC) 10 MG tablet Take 10 mg by mouth at bedtime.   Yes Historical Provider, MD  lisinopril (PRINIVIL,ZESTRIL) 2.5 MG tablet Take 2.5 mg by mouth daily.   Yes Historical Provider, MD  metoprolol tartrate (LOPRESSOR) 25 MG tablet Take 12.5 mg by mouth 2 (two) times daily.   Yes  Historical Provider, MD  montelukast (SINGULAIR) 10 MG tablet Take 10 mg by mouth daily.   Yes Historical Provider, MD  naproxen sodium (ANAPROX) 220 MG tablet Take 220 mg by mouth 2 (two) times daily.   Yes Historical Provider, MD  nystatin cream (MYCOSTATIN) Apply 1 application topically 3 (three) times daily as needed (for itching/irritation).   Yes Historical Provider, MD  pantoprazole (PROTONIX) 40 MG tablet Take 1 tablet (40 mg total) by mouth daily. 01/06/13  Yes Shelia Media, MD  simvastatin (ZOCOR) 10 MG tablet Take 10 mg by mouth at bedtime.   Yes Historical Provider, MD  amoxicillin-clavulanate (AUGMENTIN) 875-125 MG tablet Take 1 tablet by mouth 2 (two) times daily. 05/15/15 05/22/15  Governor Rooks,  MD      PHYSICAL EXAMINATION:   VITAL SIGNS: Blood pressure 119/84, pulse 106, temperature 97.8 F (36.6 C), temperature source Oral, resp. rate 17, height 5\' 1"  (1.549 m), weight 63.504 kg (140 lb), SpO2 92 %.  GENERAL:  81 y.o.-year-old patient lying in the bed with no acute distress. Chronically ill-appearing  EYES: Pupils equal, round, reactive to light and accommodation. No scleral icterus. Extraocular muscles intact. Facial droop chronic HEENT: Head atraumatic, normocephalic. Oropharynx and nasopharynx clear.  NECK:  Supple, no jugular venous distention. No thyroid enlargement, no tenderness.  LUNGS: Normal breath sounds bilaterally, no wheezing, rales,rhonchi or crepitation. No use of accessory muscles of respiration.  CARDIOVASCULAR: S1, S2 normal. No murmurs, rubs, or gallops.  ABDOMEN: Soft, nontender, nondistended. Bowel sounds present. No organomegaly or mass.  EXTREMITIES: No pedal edema, cyanosis, or clubbing.  NEUROLOGIC: Cranial nerves II through XII are intact. Facial droop . Sensation intact. Gait not checked.  PSYCHIATRIC: The patient is alert not oriented to place person or time SKIN: No obvious rash, lesion, or ulcer.   LABORATORY PANEL:   CBC  Recent Labs Lab 05/15/15 1459  WBC 9.1  HGB 12.7  HCT 40.7  PLT 319  MCV 73.0*  MCH 22.8*  MCHC 31.3*  RDW 17.7*  LYMPHSABS 2.7  MONOABS 0.9  EOSABS 0.1  BASOSABS 0.0   ------------------------------------------------------------------------------------------------------------------  Chemistries   Recent Labs Lab 05/15/15 1459  NA 140  K 3.1*  CL 103  CO2 25  GLUCOSE 116*  BUN 24*  CREATININE 0.95  CALCIUM 8.8*  AST 14*  ALT 7*  ALKPHOS 75  BILITOT 0.8   ------------------------------------------------------------------------------------------------------------------ estimated creatinine clearance is 32.9 mL/min (by C-G formula based on Cr of  0.95). ------------------------------------------------------------------------------------------------------------------ No results for input(s): TSH, T4TOTAL, T3FREE, THYROIDAB in the last 72 hours.  Invalid input(s): FREET3   Coagulation profile No results for input(s): INR, PROTIME in the last 168 hours. ------------------------------------------------------------------------------------------------------------------- No results for input(s): DDIMER in the last 72 hours. -------------------------------------------------------------------------------------------------------------------  Cardiac Enzymes  Recent Labs Lab 05/15/15 1459  TROPONINI <0.03   ------------------------------------------------------------------------------------------------------------------ Invalid input(s): POCBNP  ---------------------------------------------------------------------------------------------------------------  Urinalysis    Component Value Date/Time   COLORURINE Amber 11/01/2014 1358   APPEARANCEUR Clear 11/01/2014 1358   LABSPEC 1.026 11/01/2014 1358   PHURINE 5.0 11/01/2014 1358   GLUCOSEU Negative 11/01/2014 1358   HGBUR Negative 11/01/2014 1358   BILIRUBINUR Negative 11/01/2014 1358   BILIRUBINUR neg 01/09/2013 1027   KETONESUR Trace 11/01/2014 1358   PROTEINUR 30 mg/dL 16/04/9603 5409   PROTEINUR 30 01/09/2013 1027   UROBILINOGEN 2.0 01/09/2013 1027   NITRITE Negative 11/01/2014 1358   NITRITE neg 01/09/2013 1027   LEUKOCYTESUR Negative 11/01/2014 1358   LEUKOCYTESUR small (1+) 01/09/2013 1027     RADIOLOGY:  Dg Chest 2 View  05/15/2015  CLINICAL DATA:  81 year old female with shortness of breath today with getting in and out of a car. EXAM: CHEST  2 VIEW COMPARISON:  Chest x-ray 08/17/2013. FINDINGS: Mild diffuse peribronchial cuffing. Lung volumes are low. No consolidative airspace disease. No pleural effusions. No pneumothorax. No evidence of pulmonary edema. Heart  size is borderline enlarged, similar to the prior examination. Upper mediastinal contours are within normal limits allowing for patient rotation to the left. Atherosclerosis in the thoracic aorta. IMPRESSION: 1. Mild diffuse peribronchial cuffing suggestive of acute bronchitis. 2. Atherosclerosis. Electronically Signed   By: Trudie Reed M.D.   On: 05/15/2015 15:58   Ct Abdomen Pelvis W Contrast  05/15/2015  CLINICAL DATA:  81 year old female with history of abdominal pain and diarrhea for the past 5 days. Prior history of bowel obstruction. EXAM: CT ABDOMEN AND PELVIS WITH CONTRAST TECHNIQUE: Multidetector CT imaging of the abdomen and pelvis was performed using the standard protocol following bolus administration of intravenous contrast. CONTRAST:  75mL OMNIPAQUE IOHEXOL 300 MG/ML  SOLN COMPARISON:  No priors. FINDINGS: Lower chest: Scarring in the lung bases bilaterally. Atherosclerotic calcifications in the left circumflex coronary artery. Calcifications of the aortic valve. Hepatobiliary: Several sub cm low-attenuation liver lesions are noted, too small to definitively characterize but favored to represent small cysts. Mild periportal edema. No intra or extrahepatic biliary ductal dilatation. Status post cholecystectomy. Pancreas: No pancreatic mass. No pancreatic ductal dilatation. No pancreatic or peripancreatic fluid or inflammatory changes. Severe atrophy of the distal body and tail of the pancreas. Spleen: Unremarkable. Adrenals/Urinary Tract: Cortical thinning in the upper pole of the right kidney, presumably post infectious or inflammatory scarring. There are some tiny parenchymal calcifications in this region which are likely dystrophic. Sub cm low-attenuation lesion in the lateral aspect of the upper pole of the left kidney is too small to definitively characterize, but is statistically likely a small cyst. No hydroureteronephrosis. Urinary bladder is normal in appearance. Stomach/Bowel: Normal  appearance of the stomach. No pathologic dilatation of small bowel or colon. Numerous colonic diverticular noted. Importantly, there are extensive inflammatory changes adjacent to the distal descending colon and proximal sigmoid colon in the a region of numerous diverticuli, where there is also extensive colonic wall thickening, presumably secondary to acute diverticulitis. No discrete diverticular abscess is confidently identified at this time. No signs of frank perforation. Vascular/Lymphatic: Extensive atherosclerosis throughout the abdominal and pelvic vasculature, without evidence of aneurysm or dissection. No lymphadenopathy noted in the abdomen or pelvis. Reproductive: Uterus and ovaries are atrophic. Dilated left gonadal vein incidentally noted. Other: Trace volume of ascites in the low anatomic pelvis, presumably reactive. No larger volume of ascites. No pneumoperitoneum. Musculoskeletal: There are no aggressive appearing lytic or blastic lesions noted in the visualized portions of the skeleton. IMPRESSION: 1. Colonic diverticulitis at the junction the distal descending colon and proximal sigmoid colon. No discrete diverticular abscess is identified at this time. No signs of frank perforation. Given the extensive colonic wall thickening in this region (which is favored to be chronic related to chronic diverticular disease), a followup nonemergent colonoscopy should be considered in the near future after complete resolution of the patient's acute symptoms to exclude the possibility of concurrent colonic neoplasm. 2. Mild diffuse periportal edema. This is of uncertain etiology and significance, but correlation with liver function tests is suggested. 3. Status post cholecystectomy. 4. Extensive atherosclerosis. 5. Additional incidental findings, as above. Electronically Signed   By: Brayton Mars.D.  On: 05/15/2015 18:49    EKG: Orders placed or performed during the hospital encounter of 05/15/15  .  EKG 12-Lead  . EKG 12-Lead  . ED EKG  . ED EKG    IMPRESSION AND PLAN: Patient is a 81 year old presents with generalized weakness diarrhea  1. Generalized weakness likely due to poor by mouth intake as a result of acute diverticulitis and diarrhea- we'll provide her with IV fluids treat the diverticulitis  2. Diarrhea usually diverticulitis does not cause diarrhea will get stool studies, check for C. Difficile.  3. History of CVA continue aspirin  4. Hypertension we will hold lisinopril for now  5. GERD place him on Protonix  6. Hyperlipidemia continue simvastatin  7, code full  8. Miscellaneous DVT prophylaxis I will hold any anticoagulation in light of patient having 1 episode of blood in her stool  All the records are reviewed and case discussed with ED provider. Management plans discussed with the patient, family and they are in agreement.  CODE STATUS: full     TOTAL TIME TAKING CARE OF THIS PATIENT:  55 minutes.    Auburn Bilberry M.D on 05/15/2015 at 8:35 PM  Between 7am to 6pm - Pager - [phone]  After 6pm go to [email or web address] - password [email or web address] [facility]  [facility]  Office  [phone]  CC: Primary care physician; Wynona Dove, MD

## 2015-05-15 NOTE — ED Provider Notes (Addendum)
[facility] Emergency Department Provider Note   ____________________________________________  Time seen: 3:05 PM I have reviewed the triage vital signs and the triage nursing note.  HISTORY  Chief Complaint Weakness   Historian Patient, is a poor historian History provided by daughters, one of whom lives with the patient.  HPI Anna [name] is a 81 y.o. female who lives at home with her daughter, is here for evaluation of loose stools for several days, as well as pelvic/left lower quadrant pain. No exacerbating or alleviating factors. No fever. Positive for nausea, no vomiting. No bloody stool. No shortness of breath or trouble breathing. Urinary tract infections in the past.She has history of prior bowel obstruction that was found after patient had diarrhea with blood in it.    Past Medical History  Diagnosis Date  . Hypertension   . GERD (gastroesophageal reflux disease)   . Allergy   . Hyperlipidemia   . Anxiety   . Osteoporosis   . Arthritis     DEGENERATIVE; AFFECTING R KNEE  . Palpitations   . Lower GI bleed 2005 2009    SECONDARY TO DIVERTICULOSIS  . Hyperglycemia   . Recurrent UTI   . Recurrent aspiration bronchitis/pneumonia (HCC)   . Diverticulosis   . Bladder prolapse   . CVA (cerebral infarction)     Patient Active Problem List   Diagnosis Date Noted  . Diverticulitis 05/15/2015  . Yeast infection 01/09/2013  . Dysuria 01/09/2013  . Bereavement 12/14/2012  . Impaired gait 12/14/2012  . Vitamin D deficiency 03/09/2012  . GERD (gastroesophageal reflux disease) 12/29/2011  . Iron deficiency anemia 12/29/2011  . Elevated blood sugar 12/29/2011  . Memory loss 12/29/2011  . Rectal/anal hemorrhage 08/26/2011  . Anxiety 07/09/2011  . Hyperlipidemia 07/01/2011  . Hypertension 07/01/2011    Past Surgical History  Procedure Laterality Date  . Cholecystectomy    . Abdominal hysterectomy    . Hemorroidectomy      Current  Outpatient Rx  Name  Route  Sig  Dispense  Refill  . aspirin EC 81 MG tablet   Oral   Take 81 mg by mouth daily.           . cetirizine (ZYRTEC) 10 MG tablet   Oral   Take 10 mg by mouth at bedtime.         Marland Kitchen lisinopril (PRINIVIL,ZESTRIL) 2.5 MG tablet   Oral   Take 2.5 mg by mouth daily.         . metoprolol tartrate (LOPRESSOR) 25 MG tablet   Oral   Take 12.5 mg by mouth 2 (two) times daily.         . montelukast (SINGULAIR) 10 MG tablet   Oral   Take 10 mg by mouth daily.         . naproxen sodium (ANAPROX) 220 MG tablet   Oral   Take 220 mg by mouth 2 (two) times daily.         Marland Kitchen nystatin cream (MYCOSTATIN)   Topical   Apply 1 application topically 3 (three) times daily as needed (for itching/irritation).         . pantoprazole (PROTONIX) 40 MG tablet   Oral   Take 1 tablet (40 mg total) by mouth daily.   30 tablet   6   . simvastatin (ZOCOR) 10 MG tablet   Oral   Take 10 mg by mouth at bedtime.         Marland Kitchen amoxicillin-clavulanate (AUGMENTIN)  875-125 MG tablet   Oral   Take 1 tablet by mouth 2 (two) times daily.   19 tablet   0     Allergies Ciprofloxacin and Macrobid  Family History  Problem Relation Age of Onset  . Mental illness Mother     DEMENTIA  . Stroke Mother     MINI STROKES  . Cancer Father     PANCREATIC  . Cancer Sister     STOMACH    Social History Social History  Substance Use Topics  . Smoking status: Never Smoker   . Smokeless tobacco: Never Used  . Alcohol Use: No    Review of Systems  Constitutional: Negative for fever. Eyes: Negative for visual changes. ENT: Negative for sore throat. Cardiovascular: Negative for chest pain. Respiratory: Negative for shortness of breath. Gastrointestinal: Positive as per history of present illness. Genitourinary: Negative for dysuria. Musculoskeletal: Negative for back pain. Skin: Negative for rash. Neurological: Negative for headache. 10 point Review of Systems  otherwise negative ____________________________________________   PHYSICAL EXAM:  VITAL SIGNS: ED Triage Vitals  Enc Vitals Group     BP 05/15/15 1443 104/63 mmHg     Pulse Rate 05/15/15 1443 110     Resp --      Temp 05/15/15 1443 97.8 F (36.6 C)     Temp Source 05/15/15 1443 Oral     SpO2 05/15/15 1443 94 %     Weight 05/15/15 1443 140 lb (63.504 kg)     Height 05/15/15 1443  (1.549 m)     Head Cir --      Peak Flow --      Pain Score --      Pain Loc --      Pain Edu? --      Excl. in GC? --      Constitutional: Alert and oriented. Well appearing and in no distress. Eyes: Conjunctivae are normal. PERRL. Normal extraocular movements. ENT   Head: Normocephalic and atraumatic.   Nose: No congestion/rhinnorhea.   Mouth/Throat: Mucous membranes are moist.   Neck: No stridor. Cardiovascular/Chest: Tachycardic, regular.  No murmurs, rubs, or gallops. Respiratory: Normal respiratory effort without tachypnea nor retractions. Breath sounds are clear and equal bilaterally. No wheezes/rales/rhonchi. Gastrointestinal: Soft. No distention, no guarding, no rebound. Moderate tenderness to palpation suprapubic and left lower quadrant.  Genitourinary/rectal:Deferred Musculoskeletal: Nontender with normal range of motion in all extremities. No joint effusions.  No lower extremity tenderness.  No edema. Neurologic:  Difficult to understand speech. Right arm and right leg weakness, chronic. Normal left sided strength. Skin:  Skin is warm, dry and intact. No rash noted.   ____________________________________________   EKG I, Governor Rooks, MD, the attending physician have personally viewed and interpreted all ECGs.  101 bpm. Sinus tachycardia. Nonspecific intraventricular conduction delay. Left axis deviation. LVH. Nonspecific T-wave ____________________________________________  LABS (pertinent positives/negatives)  Comprehensive metabolic panel significant for  potassium 3.1, and otherwise no significant abnormality White blood cell count is normal at 9.1, hemoglobin 12.7 and platelet count 319 Troponin less than 0.03  ____________________________________________  RADIOLOGY All Xrays were viewed by me. Imaging interpreted by Radiologist.  CT abdomen and pelvis with contrast:  FINDINGS: Lower chest: Scarring in the lung bases bilaterally. Atherosclerotic calcifications in the left circumflex coronary artery. Calcifications of the aortic valve.  Hepatobiliary: Several sub cm low-attenuation liver lesions are noted, too small to definitively characterize but favored to represent small cysts. Mild periportal edema. No intra or extrahepatic biliary ductal dilatation.  Status post cholecystectomy.  Pancreas: No pancreatic mass. No pancreatic ductal dilatation. No pancreatic or peripancreatic fluid or inflammatory changes. Severe atrophy of the distal body and tail of the pancreas.  Spleen: Unremarkable.  Adrenals/Urinary Tract: Cortical thinning in the upper pole of the right kidney, presumably post infectious or inflammatory scarring. There are some tiny parenchymal calcifications in this region which are likely dystrophic. Sub cm low-attenuation lesion in the lateral aspect of the upper pole of the left kidney is too small to definitively characterize, but is statistically likely a small cyst. No hydroureteronephrosis. Urinary bladder is normal in appearance.  Stomach/Bowel: Normal appearance of the stomach. No pathologic dilatation of small bowel or colon. Numerous colonic diverticular noted. Importantly, there are extensive inflammatory changes adjacent to the distal descending colon and proximal sigmoid colon in the a region of numerous diverticuli, where there is also extensive colonic wall thickening, presumably secondary to acute diverticulitis. No discrete diverticular abscess is confidently identified at this time. No signs of  frank perforation.  Vascular/Lymphatic: Extensive atherosclerosis throughout the abdominal and pelvic vasculature, without evidence of aneurysm or dissection. No lymphadenopathy noted in the abdomen or pelvis.  Reproductive: Uterus and ovaries are atrophic. Dilated left gonadal vein incidentally noted.  Other: Trace volume of ascites in the low anatomic pelvis, presumably reactive. No larger volume of ascites. No pneumoperitoneum.  Musculoskeletal: There are no aggressive appearing lytic or blastic lesions noted in the visualized portions of the skeleton.  IMPRESSION: 1. Colonic diverticulitis at the junction the distal descending colon and proximal sigmoid colon. No discrete diverticular abscess is identified at this time. No signs of frank perforation. Given the extensive colonic wall thickening in this region (which is favored to be chronic related to chronic diverticular disease), a followup nonemergent colonoscopy should be considered in the near future after complete resolution of the patient's acute symptoms to exclude the possibility of concurrent colonic neoplasm. 2. Mild diffuse periportal edema. This is of uncertain etiology and significance, but correlation with liver function tests is suggested. 3. Status post cholecystectomy. 4. Extensive atherosclerosis. 5. Additional incidental findings, as above.    Chest x-ray two-view:   IMPRESSION: 1. Mild diffuse peribronchial cuffing suggestive of acute bronchitis. 2. Atherosclerosis. __________________________________________  PROCEDURES  Procedure(s) performed: None  Critical Care performed: None  ____________________________________________   ED COURSE / ASSESSMENT AND PLAN  CONSULTATIONS: Face-to-face with hospitalist for admission.  Pertinent labs & imaging results that were available during my care of the patient were reviewed by me and considered in my medical decision making (see chart for  details).   Patient has had nausea and moderate intermittent abdominal pain. Her laboratory evaluation and exam are pretty reassuring, however family states that she has had diverticulosis in the past. CT scan was obtained, and it does show diverticulitis. Initially I ordered Augmentin by mouth, however family states she is unable to take by mouth right now due to nausea.  Family states that she is too weak to stand, and is having persistent diarrhea. She has had several episodes of diarrhea here in the emergency department. I will treat her with IV Cipro and Flagyl. There is a note that she has nausea due to Cipro, and it sounds like this was a side effect, not a allergy. They're comfortable with trying this and about it. Patient will be admitted to the hospital for treatment of acute diverticulitis.  Patient / Family / Caregiver informed of clinical course, medical decision-making process, and agree with plan.   ___________________________________________  FINAL CLINICAL IMPRESSION(S) / ED DIAGNOSES   Final diagnoses:  Diverticulitis of large intestine without perforation or abscess without bleeding  Diarrhea, unspecified type       Governor Rooks, MD 05/15/15 1903  Governor Rooks, MD 05/15/15 2137

## 2015-05-15 NOTE — Discharge Instructions (Signed)
Your CAT scan showed diverticulitis, and you're being treated with anabolic called Augmentin. Return to the emergency department for any new or worsening condition including any fever, worsening abdominal pain, inability take your antibiotic, or any other symptoms concerning to you.  It is recommended that you follow up with the primary care physician within one week. You may need to have a colonoscopy after your diverticulitis episode is resolved, due to thickening of the colon, to rule out any underlying colon mass.   Diverticulitis Diverticulitis is when small pockets that have formed in your colon (large intestine) become infected or swollen. HOME CARE  Follow your doctor's instructions.  Follow a special diet if told by your doctor.  When you feel better, your doctor may tell you to change your diet. You may be told to eat a lot of fiber. Fruits and vegetables are good sources of fiber. Fiber makes it easier to poop (have bowel movements).  Take supplements or probiotics as told by your doctor.  Only take medicines as told by your doctor.  Keep all follow-up visits with your doctor. GET HELP IF:  Your pain does not get better.  You have a hard time eating food.  You are not pooping like normal. GET HELP RIGHT AWAY IF:  Your pain gets worse.  Your problems do not get better.  Your problems suddenly get worse.  You have a fever.  You keep throwing up (vomiting).  You have bloody or black, tarry poop (stool). MAKE SURE YOU:   Understand these instructions.  Will watch your condition.  Will get help right away if you are not doing well or get worse.   This information is not intended to replace advice given to you by your health care provider. Make sure you discuss any questions you have with your health care provider.   Document Released: 12/16/2007 Document Revised: 07/04/2013 Document Reviewed: 05/24/2013 Elsevier Interactive Patient Education [employer]2016 Elsevier  Inc.

## 2015-05-15 NOTE — ED Notes (Signed)
Pt with liquid diarrhea since Friday. sats 88% RA, HR 110 in triage. 94% on [facility]

## 2015-05-15 NOTE — ED Notes (Signed)
Pt taken to ct  Dr Shaune PollackLord aware that she could not drink contrast

## 2015-05-15 NOTE — ED Notes (Signed)
Brought in by family with diarrhea since Friday. Hx of bowel obstruction in past .The family thinks this may be the same.

## 2015-05-15 NOTE — ED Notes (Signed)
Pt was incont of stool  Pt cleaned up and repositioned. Buttocks area and irritated

## 2015-05-16 LAB — URINALYSIS COMPLETE WITH MICROSCOPIC (ARMC ONLY)
BILIRUBIN URINE: NEGATIVE
Glucose, UA: NEGATIVE mg/dL
Nitrite: NEGATIVE
PH: 5 (ref 5.0–8.0)
Protein, ur: NEGATIVE mg/dL
Specific Gravity, Urine: 1.032 — ABNORMAL HIGH (ref 1.005–1.030)

## 2015-05-16 LAB — CBC
HEMATOCRIT: 31.9 % — AB (ref 35.0–47.0)
Hemoglobin: 10.4 g/dL — ABNORMAL LOW (ref 12.0–16.0)
MCH: 23.3 pg — ABNORMAL LOW (ref 26.0–34.0)
MCHC: 32.5 g/dL (ref 32.0–36.0)
MCV: 71.9 fL — ABNORMAL LOW (ref 80.0–100.0)
PLATELETS: 180 10*3/uL (ref 150–440)
RBC: 4.44 MIL/uL (ref 3.80–5.20)
RDW: 17 % — ABNORMAL HIGH (ref 11.5–14.5)
WBC: 5.5 10*3/uL (ref 3.6–11.0)

## 2015-05-16 LAB — BASIC METABOLIC PANEL
ANION GAP: 4 — AB (ref 5–15)
BUN: 21 mg/dL — ABNORMAL HIGH (ref 6–20)
CALCIUM: 7.6 mg/dL — AB (ref 8.9–10.3)
CO2: 22 mmol/L (ref 22–32)
CREATININE: 0.76 mg/dL (ref 0.44–1.00)
Chloride: 116 mmol/L — ABNORMAL HIGH (ref 101–111)
GLUCOSE: 88 mg/dL (ref 65–99)
Potassium: 3.8 mmol/L (ref 3.5–5.1)
Sodium: 142 mmol/L (ref 135–145)

## 2015-05-16 LAB — C DIFFICILE QUICK SCREEN W PCR REFLEX
C DIFFICILE (CDIFF) INTERP: NEGATIVE
C DIFFICLE (CDIFF) ANTIGEN: NEGATIVE
C Diff toxin: NEGATIVE

## 2015-05-16 MED ORDER — LISINOPRIL 5 MG PO TABS
2.5000 mg | ORAL_TABLET | Freq: Every day | ORAL | Status: DC
  Administered 2015-05-16 – 2015-05-20 (×5): 2.5 mg via ORAL
  Filled 2015-05-16 (×5): qty 1

## 2015-05-16 NOTE — Care Management Note (Signed)
Case Management Note  Patient Details  Name: Anna [name] MRN: [id number] Date of Birth: Oct 25, 1921  Subjective/Objective:   Admitted with acute colonic diverticulitis. Receiving IV antibiotics. Presents from home where she lives with her daughter.  Patient has a sitter that cares for her. PT with home health PT verses SNF.  Will discuss more with family once PT reevaluates.               Action/Plan: PT plan pending.   Expected Discharge Date:                  Expected Discharge Plan:     In-House Referral:     Discharge planning Services     Post Acute Care Choice:    Choice offered to:     DME Arranged:    DME Agency:     HH Arranged:    HH Agency:     Status of Service:  In process, will continue to follow  Medicare Important Message Given:    Date Medicare IM Given:    Medicare IM give by:    Date Additional Medicare IM Given:    Additional Medicare Important Message give by:     If discussed at Long Length of Stay Meetings, dates discussed:    Additional Comments:  Marily MemosLisa M Kanyia Heaslip, RN 05/16/2015, 3:10 PM

## 2015-05-16 NOTE — Evaluation (Signed)
Physical Therapy Evaluation Patient Details Name: Anna [name] MRN: [id number] DOB: 1922-01-12 Today's Date: 05/16/2015   History of Present Illness  presented to ER from home secondary to weakness and general malaise, abdominal pain and diarrhea; admitted with diverticulitis.  Clinical Impression  Upon evaluation, patient alert and oriented to basic information; follows simple commands with increased time to complete.  Speech very dysarthric with suspected expressive difficulties due to previous CVA.  R UE/LE generally hemiparetic with decreased strength and functional use noted throughout (UE 1 to 2-/5, LE 3-/5), baseline per patient and caregiver.  Do report progressive decline in functional strength and ability prior to hospitalization.  Currently requiring mod/max assist for all bed mobility and mod progressing to close sup for upright sitting balance.  Patient notably fatigued and refusing transfers or standing attempts at this time (despite encouragement from therapist and family).  Will continue efforts next date with goal of OOB to chair as able. Of note, patient with significant coughing when attempting to drink small sips of thin liquids from cup; RN informed/aware.  May benefit from ST to assess swallowing. Would benefit from skilled PT to address above deficits and promote optimal return to PLOF; recommend transition to HHPT vs. STR pending additional mobility assessment next date    Follow Up Recommendations Home health PT;SNF (pending additional mobility assessment next date)    Equipment Recommendations       Recommendations for Other Services Speech consult (significant choking noted with attempts at drinking thin liquids during session; sats remain >90-91% on 2L)     Precautions / Restrictions Precautions Precautions: Fall Restrictions Weight Bearing Restrictions: No      Mobility  Bed Mobility Overal bed mobility: Needs Assistance Bed Mobility: Rolling;Supine to  Sit;Sit to Supine Rolling: Mod assist;Max assist (mod assist towards R, max assist towards L)   Supine to sit: Max assist Sit to supine: Max assist      Transfers                 General transfer comment: patient declined this date due to fatigue  Ambulation/Gait             General Gait Details: non-ambulatory at baseline  Stairs            Wheelchair Mobility    Modified Rankin (Stroke Patients Only)       Balance Overall balance assessment: Needs assistance Sitting-balance support: No upper extremity supported;Feet supported Sitting balance-Leahy Scale: Fair Sitting balance - Comments: mod assist with initial transition to upright, improving to close sup with accommodation to position                                     Pertinent Vitals/Pain Pain Assessment: Faces Faces Pain Scale: Hurts a little bit Pain Location: R hip Pain Descriptors / Indicators: Aching Pain Intervention(s): Limited activity within patient's tolerance;Monitored during session;Repositioned    Home Living Family/patient expects to be discharged to:: Private residence Living Arrangements: Children Available Help at Discharge: Family Type of Home: House         Home Equipment: Wheelchair - manual;Bedside commode;Walker - 2 wheels;Transport chair;Hospital bed      Prior Function Level of Independence: Needs assistance         Comments: Patient non-ambulatory at baseline, utilizes manual WC as primary mobility within the home (unable to self-propel).  Assist for all bed mobility, transfers and ADLs; performs  basic transfers via SPT (? mod assist per caregiver description?) without assist device (unable to take steps, advance LEs; simply pivots once upright)     Hand Dominance   Dominant Hand: Right    Extremity/Trunk Assessment   Upper Extremity Assessment:  (R UE generally at least 1 to 2-/5 throughout; maintains gross grasp position with mild  flexion synergy)           Lower Extremity Assessment:  (R LE grossly 3-/5 throughout, delayed speed of activation, delayed coordination)         Communication   Communication: Expressive difficulties (dysarthric)  Cognition Arousal/Alertness: Awake/alert Behavior During Therapy: WFL for tasks assessed/performed Overall Cognitive Status: Within Functional Limits for tasks assessed                      General Comments      Exercises Other Exercises Other Exercises: Rolling bilat for hygiene, self-care and linen change after incontinent bowel, mod/max assist.  Improved abilityt o assist with rolling towards R (still with hand-over hand at times).  Dep for hygiene and repositioning in bed.      Assessment/Plan    PT Assessment Patient needs continued PT services  PT Diagnosis Generalized weakness   PT Problem List Decreased strength;Decreased activity tolerance;Decreased range of motion;Decreased balance;Decreased mobility;Decreased coordination;Decreased knowledge of use of DME;Decreased safety awareness;Decreased knowledge of precautions  PT Treatment Interventions DME instruction;Functional mobility training;Therapeutic activities;Therapeutic exercise;Balance training;Patient/family education   PT Goals (Current goals can be found in the Care Plan section) Acute Rehab PT Goals Patient Stated Goal: "to lay back down and rest" PT Goal Formulation: With patient/family Time For Goal Achievement: 05/30/15 Potential to Achieve Goals: Fair    Frequency Min 2X/week   Barriers to discharge        Co-evaluation               End of Session   Activity Tolerance: Patient limited by fatigue Patient left: in bed;with call bell/phone within reach;with bed alarm set;with family/visitor present           Time: 1610-9604 PT Time Calculation (min) (ACUTE ONLY): 31 min   Charges:   PT Evaluation $Initial PT Evaluation Tier I: 1 Procedure PT  Treatments $Therapeutic Activity: 8-22 mins   PT G Codes:        Anna [name], PT, DPT, NCS 05/16/2015, 1:49 PM [phone]

## 2015-05-16 NOTE — Progress Notes (Signed)
[facility] - [place] at [facility]   PATIENT NAME: Anna [name]    MR#:  [id number]  DATE OF BIRTH:  [birth year]  SUBJECTIVE:  Patient pleasantly demented. No acute events overnight.  REVIEW OF SYSTEMS:    Review of Systems  Unable to perform ROS: dementia    Tolerating Diet:yes      DRUG ALLERGIES:   Allergies  Allergen Reactions  . Ciprofloxacin Nausea And Vomiting  . Macrobid [Nitrofurantoin Monohydrate Macrocrystals] Nausea And Vomiting    VITALS:  Blood pressure 135/48, pulse 79, temperature 97.8 F (36.6 C), temperature source Oral, resp. rate 14, height  (1.549 m), weight 59.966 kg (132 lb 3.2 oz), SpO2 95 %.  PHYSICAL EXAMINATION:   Physical Exam  Constitutional: She is well-developed, well-nourished, and in no distress. No distress.  HENT:  Head: Normocephalic.  Eyes: No scleral icterus.  Neck: Normal range of motion. Neck supple. No JVD present. No tracheal deviation present.  Cardiovascular: Normal rate and regular rhythm.  Exam reveals no gallop and no friction rub.   Murmur heard. Pulmonary/Chest: Effort normal and breath sounds normal. No respiratory distress. She has no wheezes. She has no rales. She exhibits no tenderness.  Abdominal: Soft. Bowel sounds are normal. She exhibits no distension and no mass. There is tenderness. There is no rebound and no guarding.  Musculoskeletal: Normal range of motion. She exhibits no edema.  Neurological: She is alert.  Right sided weakness and mild aphasia at baseline from previous CVA  Skin: Skin is warm. No rash noted. No erythema.      LABORATORY PANEL:   CBC  Recent Labs Lab 05/16/15 0434  WBC 5.5  HGB 10.4*  HCT 31.9*  PLT 180   ------------------------------------------------------------------------------------------------------------------  Chemistries   Recent Labs Lab 05/15/15 1459 05/16/15 0434  NA 140 142  K 3.1* 3.8  CL 103 116*  CO2 25 22  GLUCOSE  116* 88  BUN 24* 21*  CREATININE 0.95 0.76  CALCIUM 8.8* 7.6*  AST 14*  --   ALT 7*  --   ALKPHOS 75  --   BILITOT 0.8  --    ------------------------------------------------------------------------------------------------------------------  Cardiac Enzymes  Recent Labs Lab 05/15/15 1459  TROPONINI <0.03   ------------------------------------------------------------------------------------------------------------------  RADIOLOGY:  Dg Chest 2 View  05/15/2015  CLINICAL DATA:  81 year old female with shortness of breath today with getting in and out of a car. EXAM: CHEST  2 VIEW COMPARISON:  Chest x-ray 08/17/2013. FINDINGS: Mild diffuse peribronchial cuffing. Lung volumes are low. No consolidative airspace disease. No pleural effusions. No pneumothorax. No evidence of pulmonary edema. Heart size is borderline enlarged, similar to the prior examination. Upper mediastinal contours are within normal limits allowing for patient rotation to the left. Atherosclerosis in the thoracic aorta. IMPRESSION: 1. Mild diffuse peribronchial cuffing suggestive of acute bronchitis. 2. Atherosclerosis. Electronically Signed   By: Trudie Reed M.D.   On: 05/15/2015 15:58   Ct Abdomen Pelvis W Contrast  05/15/2015  CLINICAL DATA:  81 year old female with history of abdominal pain and diarrhea for the past 5 days. Prior history of bowel obstruction. EXAM: CT ABDOMEN AND PELVIS WITH CONTRAST TECHNIQUE: Multidetector CT imaging of the abdomen and pelvis was performed using the standard protocol following bolus administration of intravenous contrast. CONTRAST:  75mL OMNIPAQUE IOHEXOL 300 MG/ML  SOLN COMPARISON:  No priors. FINDINGS: Lower chest: Scarring in the lung bases bilaterally. Atherosclerotic calcifications in the left circumflex coronary artery. Calcifications of the aortic valve. Hepatobiliary: Several sub  cm low-attenuation liver lesions are noted, too small to definitively characterize but favored to  represent small cysts. Mild periportal edema. No intra or extrahepatic biliary ductal dilatation. Status post cholecystectomy. Pancreas: No pancreatic mass. No pancreatic ductal dilatation. No pancreatic or peripancreatic fluid or inflammatory changes. Severe atrophy of the distal body and tail of the pancreas. Spleen: Unremarkable. Adrenals/Urinary Tract: Cortical thinning in the upper pole of the right [name], presumably post infectious or inflammatory scarring. There are some tiny parenchymal calcifications in this region which are likely dystrophic. Sub cm low-attenuation lesion in the lateral aspect of the upper pole of the left [name] is too small to definitively characterize, but is statistically likely a small cyst. No hydroureteronephrosis. Urinary bladder is normal in appearance. Stomach/Bowel: Normal appearance of the stomach. No pathologic dilatation of small bowel or colon. Numerous colonic diverticular noted. Importantly, there are extensive inflammatory changes adjacent to the distal descending colon and proximal sigmoid colon in the a region of numerous diverticuli, where there is also extensive colonic wall thickening, presumably secondary to acute diverticulitis. No discrete diverticular abscess is confidently identified at this time. No signs of frank perforation. Vascular/Lymphatic: Extensive atherosclerosis throughout the abdominal and pelvic vasculature, without evidence of aneurysm or dissection. No lymphadenopathy noted in the abdomen or pelvis. Reproductive: Uterus and ovaries are atrophic. Dilated left gonadal vein incidentally noted. Other: Trace volume of ascites in the low anatomic pelvis, presumably reactive. No larger volume of ascites. No pneumoperitoneum. Musculoskeletal: There are no aggressive appearing lytic or blastic lesions noted in the visualized portions of the skeleton. IMPRESSION: 1. Colonic diverticulitis at the junction the distal descending colon and proximal sigmoid  colon. No discrete diverticular abscess is identified at this time. No signs of frank perforation. Given the extensive colonic wall thickening in this region (which is favored to be chronic related to chronic diverticular disease), a followup nonemergent colonoscopy should be considered in the near future after complete resolution of the patient's acute symptoms to exclude the possibility of concurrent colonic neoplasm. 2. Mild diffuse periportal edema. This is of uncertain etiology and significance, but correlation with liver function tests is suggested. 3. Status post cholecystectomy. 4. Extensive atherosclerosis. 5. Additional incidental findings, as above. Electronically Signed   By: Trudie Reedaniel  Entrikin M.D.   On: 05/15/2015 18:49     ASSESSMENT AND PLAN:   81 year old female who presented with generalized weakness and found to have acute diverticulitis.  1. Acute colonic diverticulitis: Patient will continue on ciprofloxacin and Flagyl. I will continue IV medications through today and possibly change oral tomorrow.  2. Diarrhea: Diarrhea seems to have resolved. Stool cultures are pending.   3. History of CVA: Patient continues have left-sided paralysis. Continue aspirin  4. Essential hypertension: Continue lisinopril  5. Hyperlipidemia: Continue simvastatin      CODE STATUS: FULL  TOTAL TIME TAKING CARE OF THIS PATIENT: 30 minutes.     POSSIBLE D/C 1-2 days, DEPENDING ON CLINICAL CONDITION.   Antwon Rochin M.D on 05/16/2015 at 12:15 PM  Between 7am to 6pm - Pager - [phone] After 6pm go to [email or web address] - password [email or web address]  [facility]io [facility] [place] [facility]  Office  [phone]  CC: Primary care physician; Wynona DoveWALKER,JENNIFER AZBELL, MD  Note: This dictation was prepared with Dragon dictation along with smaller phrase technology. Any transcriptional errors that result from this process are unintentional.

## 2015-05-17 ENCOUNTER — Inpatient Hospital Stay: Payer: Medicare Other

## 2015-05-17 LAB — BASIC METABOLIC PANEL
ANION GAP: 6 (ref 5–15)
BUN: 14 mg/dL (ref 6–20)
CHLORIDE: 113 mmol/L — AB (ref 101–111)
CO2: 23 mmol/L (ref 22–32)
Calcium: 7.7 mg/dL — ABNORMAL LOW (ref 8.9–10.3)
Creatinine, Ser: 0.74 mg/dL (ref 0.44–1.00)
GFR calc Af Amer: >60 mL/min (ref >60)
GFR calc non Af Amer: >60 mL/min (ref >60)
GLUCOSE: 70 mg/dL (ref 65–99)
POTASSIUM: 4.3 mmol/L (ref 3.5–5.1)
Sodium: 142 mmol/L (ref 135–145)

## 2015-05-17 LAB — CBC
HEMATOCRIT: 32.2 % — AB (ref 35.0–47.0)
Hemoglobin: 10.1 g/dL — ABNORMAL LOW (ref 12.0–16.0)
MCH: 22.8 pg — ABNORMAL LOW (ref 26.0–34.0)
MCHC: 31.2 g/dL — ABNORMAL LOW (ref 32.0–36.0)
MCV: 73.2 fL — AB (ref 80.0–100.0)
Platelets: 213 10*3/uL (ref 150–440)
RBC: 4.4 MIL/uL (ref 3.80–5.20)
RDW: 16.9 % — ABNORMAL HIGH (ref 11.5–14.5)
WBC: 4 10*3/uL (ref 3.6–11.0)

## 2015-05-17 MED ORDER — AMOXICILLIN-POT CLAVULANATE 875-125 MG PO TABS
1.0000 | ORAL_TABLET | Freq: Two times a day (BID) | ORAL | Status: DC
  Administered 2015-05-17: 1 via ORAL
  Filled 2015-05-17 (×2): qty 1

## 2015-05-17 MED ORDER — AMOXICILLIN-POT CLAVULANATE 875-125 MG PO TABS: 1.0000 | ORAL_TABLET | Freq: Two times a day (BID) | ORAL | Status: DC

## 2015-05-17 NOTE — Care Management Important Message (Signed)
Important Message  Patient Details  Name: Anna MullDoris B Sula MRN: [id number] Date of Birth: [birth year]   Medicare Important Message Given:  Yes-second notification given    Adonis HugueninBerkhead, Calina Patrie L, RN 05/17/2015, 11:18 AM

## 2015-05-17 NOTE — Evaluation (Signed)
Clinical/Bedside Swallow Evaluation Patient Details  Name: Anna [name] MRN: [id number] Date of Birth: 11/22/1921  Today's Date: 05/17/2015 Time: SLP Start Time (ACUTE ONLY): 1031 SLP Stop Time (ACUTE ONLY): 1105 SLP Time Calculation (min) (ACUTE ONLY): 34 min  Past Medical History:  Past Medical History  Diagnosis Date  . Hypertension   . GERD (gastroesophageal reflux disease)   . Allergy   . Hyperlipidemia   . Anxiety   . Osteoporosis   . Arthritis     DEGENERATIVE; AFFECTING R KNEE  . Palpitations   . Lower GI bleed 2005 2009    SECONDARY TO DIVERTICULOSIS  . Hyperglycemia   . Recurrent UTI   . Recurrent aspiration bronchitis/pneumonia (HCC)   . Diverticulosis   . Bladder prolapse   . CVA (cerebral infarction)    Past Surgical History:  Past Surgical History  Procedure Laterality Date  . Cholecystectomy    . Abdominal hysterectomy    . Hemorroidectomy     HPI:      Assessment / Plan / Recommendation Clinical Impression  pt presents with a mild oral pharyngeal dysphagia charactorized by pt cough with large bites and sips, likely due to diverticulitis and GERD.  pt with a known hx of dysphagia and strategies in place to reduce risk of aspiraiotn. While utilizing strategies pt was not  ntoed to have any overt ssx aspriation. ST reviewed strategies with family and nursing to ensure compliance. Diet is recommended to remain the same.     Aspiration Risk  Mild    Diet Recommendation Dysphagia 3 (Mech soft);Thin   Medication Administration: Whole meds with puree Compensations: Minimize environmental distractions;Slow rate;Small sips/bites;Follow solids with liquid    Other  Recommendations Oral Care Recommendations: Patient independent with oral care   Follow Up Recommendations       Frequency and Duration min 3x week  1 week   Pertinent Vitals/Pain No pain reported    SLP Swallow Goals     Swallow Study Prior Functional Status       General Date of  Onset: 05/16/15 Type of Study: Bedside swallow evaluation Diet Prior to this Study: Dysphagia 3 (soft);Thin liquids Temperature Spikes Noted: N/A Respiratory Status: Supplemental O2 delivered via (comment) History of Recent Intubation: No Behavior/Cognition: Alert;Cooperative;Pleasant mood Oral Cavity - Dentition: Adequate natural dentition/normal for age Self-Feeding Abilities: Able to feed self Patient Positioning: Upright in bed Baseline Vocal Quality: Breathy;Low vocal intensity    Oral/Motor/Sensory Function Overall Oral Motor/Sensory Function: Impaired Labial Strength: Reduced Lingual Strength: Reduced   Ice Chips Ice chips: Within functional limits Presentation: Spoon   Thin Liquid Thin Liquid: Within functional limits Presentation: Cup;Spoon Other Comments: utilizing small sips.    Nectar Thick Nectar Thick Liquid: Not tested   Honey Thick Honey Thick Liquid: Not tested   Puree Puree: Within functional limits Presentation: Spoon;Self Fed   Solid   GO    Solid: Within functional limits Presentation: Self Fed;Spoon Other Comments: pt was wfl for soft solids utilizing small bites and laternating of solids and liquids.  Family states she does this always since previous instance of dysphagia.       Meredith PelStacie Harris Sauber 05/17/2015,11:22 AM

## 2015-05-17 NOTE — Clinical Social Work Note (Signed)
Clinical Social Work Assessment  Patient Details  Name: Anna [name] MRN: [id number] Date of Birth: [birth year]  Date of referral:  05/17/15               Reason for consult:  Facility Placement                Permission sought to share information with:  Chartered certified accountant granted to share information::  Yes, Verbal Permission Granted  Name::        Agency::     Relationship::     Contact Information:     Housing/Transportation Living arrangements for the past 2 months:  Single Family Home Source of Information:  Adult Children Patient Interpreter Needed:  None Criminal Activity/Legal Involvement Pertinent to Current Situation/Hospitalization:  No - Comment as needed Significant Relationships:  Adult Children Lives with:  Adult Children Do you feel safe going back to the place where you live?  Yes Need for family participation in patient care:  Yes (Comment)  Care giving concerns:  Patient resides in the care of her daughter.   Social Worker assessment / plan:  PT informed CSW Friday afternoon that they are recommending STR. CSW met with patient's daughter briefly and she stated that they are in agreement with rehab for patient and knows the process as they have been through this before with patient. Patient's daughter agreeable to bedsearch and is aware that [facility] will have to authorize. CSW has begun prior auth with [facility] via [place]. Patient has existing pasrr.  Employment status:  Retired Nurse, adult PT Recommendations:  [place] / Referral to community resources:  [place]  Patient/Family's Response to care:  Patient's daughter expressed appreciation for CSW assistance.  Patient/Family's Understanding of and Emotional Response to Diagnosis, Current Treatment, and Prognosis:  Patient's daughter verbalizes agreement with recommendations.  Emotional  Assessment Appearance:  Appears stated age Attitude/Demeanor/Rapport:   (pleasant and quiet) Affect (typically observed):  Accepting, Adaptable Orientation:  Oriented to Self, Oriented to Place Alcohol / Substance use:  Not Applicable Psych involvement (Current and /or in the community):  No (Comment)  Discharge Needs  Concerns to be addressed:  Care Coordination Readmission within the last 30 days:  No Current discharge risk:  None Barriers to Discharge:  No Barriers Identified   Shela Leff, LCSW 05/17/2015, 4:49 PM

## 2015-05-17 NOTE — Care Management Note (Signed)
Case Management Note  Patient Details  Name: Corky MullDoris B Bohorquez MRN: [id number] Date of Birth: [birth year]  Subjective/Objective:  Attempted to call daughter, Mrs. Baker with no answer.                   Action/Plan:   Expected Discharge Date:                  Expected Discharge Plan:     In-House Referral:     Discharge planning Services     Post Acute Care Choice:    Choice offered to:     DME Arranged:    DME Agency:     HH Arranged:    HH Agency:     Status of Service:  In process, will continue to follow  Medicare Important Message Given:  Yes-second notification given Date Medicare IM Given:    Medicare IM give by:    Date Additional Medicare IM Given:    Additional Medicare Important Message give by:     If discussed at Long Length of Stay Meetings, dates discussed:    Additional Comments:  Marily MemosLisa M Raife Lizer, RN 05/17/2015, 1:44 PM

## 2015-05-17 NOTE — Progress Notes (Signed)
Dr Clint GuyHower notified of 2 small BMs moderate reddish tan in color. No new orders except advised to monitor.

## 2015-05-17 NOTE — Progress Notes (Signed)
Physical Therapy Treatment Patient Details Name: Anna [name] MRN: [id number] DOB: May 07, 1922 Today's Date: 05/17/2015    History of Present Illness presented to ER from home secondary to weakness and general malaise, abdominal pain and diarrhea; admitted with diverticulitis.    PT Comments    Patient agreeable to OOB attempts this date.  Requiring mod/max assist for sit/stand (excessive weight shift to L LE) and max assist +1-2 for SPT between seating surfaces.  Patient very fearful of transfers, demonstrates very poor weight shifting and transfer ability.  High fall risk.  Multiple attempts at spontaneous sitting prior to alignment with seating surfaces. Recommend transition to STR prior to return home to maximize safety/indep with functional transfers (goal to return to baseline of mod assist SPT) to decrease caregiver burden upon discharge.   Follow Up Recommendations  SNF     Equipment Recommendations       Recommendations for Other Services       Precautions / Restrictions Precautions Precautions: Fall Restrictions Weight Bearing Restrictions: No    Mobility  Bed Mobility Overal bed mobility: Needs Assistance Bed Mobility: Supine to Sit     Supine to sit: Max assist     General bed mobility comments: for LE management and truncal elevation; transition towards R  Transfers Overall transfer level: Needs assistance   Transfers: Sit to/from Stand Sit to Stand: Mod assist;Max assist         General transfer comment: assist for forward weight shift, dynamic balance and overall postural extension; SPT, bed/chair/BSC, mod/max assist  Ambulation/Gait             General Gait Details: non-ambulatory at baseline   Stairs            Wheelchair Mobility    Modified Rankin (Stroke Patients Only)       Balance Overall balance assessment: Needs assistance Sitting-balance support: No upper extremity supported;Feet supported Sitting balance-Leahy  Scale: Fair     Standing balance support: No upper extremity supported Standing balance-Leahy Scale: Poor                      Cognition Arousal/Alertness: Awake/alert Behavior During Therapy: WFL for tasks assessed/performed                        Exercises Other Exercises Other Exercises: Sit/stand without assist device x3, mod/max assist for lift off, dynamic balance and postural extension.  Constantly reaching for furniture/external stabilization with L UE Other Exercises: SPT, bed/chair and chair/BSC, mod/max assist--therapist assist for lateral facilitation to unweight/advance LEs; significant weakness/trendelenburg noted R hip in periods of SLS.  Patient with very poor balance; high fall risk.  very fearful of transfer and often attempts spontaneous sitting prior to cuing from therapist    General Comments        Pertinent Vitals/Pain Pain Assessment: No/denies pain    Home Living                      Prior Function            PT Goals (current goals can now be found in the care plan section) Acute Rehab PT Goals Patient Stated Goal: "to lay back down and rest" PT Goal Formulation: With patient/family Time For Goal Achievement: 05/30/15 Potential to Achieve Goals: Fair Progress towards PT goals: Progressing toward goals    Frequency  Min 2X/week    PT Plan Current plan remains appropriate  Co-evaluation             End of Session Equipment Utilized During Treatment: Gait belt Activity Tolerance: Patient tolerated treatment well Patient left: in chair;with call bell/phone within reach;with chair alarm set     Time: 1451-1539 PT Time Calculation (min) (ACUTE ONLY): 48 min  Charges:  $Therapeutic Activity: 23-37 mins $Neuromuscular Re-education: 8-22 mins                    G Codes:      Arleigh Dicola H. Manson PasseyBrown, PT, DPT, NCS 05/17/2015, 3:56 PM [phone]

## 2015-05-17 NOTE — NC FL2 (Signed)
[place] MEDICAID FL2 LEVEL OF CARE SCREENING TOOL     IDENTIFICATION  Patient Name: Anna [name] Birthdate: 1922/01/20 Sex: female Admission Date (Current Location): 05/15/2015  [facility] [place] Number:     Facility and Address:  [facility], [address], [place], [place] [address]      Provider Number: [id number]  Attending Physician Name and Address:  Adrian Saran, MD  Relative Name and Phone Number:       Current Level of Care: Hospital Recommended Level of Care: Skilled Nursing Facility Prior Approval Number:    Date Approved/Denied:   PASRR Number:    Discharge Plan: SNF    Current Diagnoses: Patient Active Problem List   Diagnosis Date Noted  . Diverticulitis 05/15/2015  . Yeast infection 01/09/2013  . Dysuria 01/09/2013  . Bereavement 12/14/2012  . Impaired gait 12/14/2012  . Vitamin D deficiency 03/09/2012  . GERD (gastroesophageal reflux disease) 12/29/2011  . Iron deficiency anemia 12/29/2011  . Elevated blood sugar 12/29/2011  . Memory loss 12/29/2011  . Rectal/anal hemorrhage 08/26/2011  . Anxiety 07/09/2011  . Hyperlipidemia 07/01/2011  . Hypertension 07/01/2011    Orientation ACTIVITIES/SOCIAL BLADDER RESPIRATION    Self  Active Incontinent Normal  BEHAVIORAL SYMPTOMS/MOOD NEUROLOGICAL BOWEL NUTRITION STATUS   (none)  (none) Incontinent Diet  PHYSICIAN VISITS COMMUNICATION OF NEEDS Height & Weight Skin  30 days Verbally   132 lbs. Normal          AMBULATORY STATUS RESPIRATION    Assist extensive Normal      Personal Care Assistance Level of Assistance  Bathing, Feeding, Dressing Bathing Assistance: Maximum assistance Feeding assistance: Maximum assistance Dressing Assistance: Maximum assistance      Functional Limitations Info  Hearing   Hearing Info: Impaired         SPECIAL CARE FACTORS FREQUENCY  PT (By licensed PT)                   Additional Factors Info  Code Status Code  Status Info: full             Current Medications (05/17/2015): Current Facility-Administered Medications  Medication Dose Route Frequency Provider Last Rate Last Dose  . 0.9 % NaCl with KCl 40 mEq / L  infusion   Intravenous Continuous Auburn Bilberry, MD 100 mL/hr at 05/17/15 1316 100 mL/hr at 05/17/15 1316  . acetaminophen (TYLENOL) tablet 650 mg  650 mg Oral Q6H PRN Auburn Bilberry, MD       Or  . acetaminophen (TYLENOL) suppository 650 mg  650 mg Rectal Q6H PRN Auburn Bilberry, MD      . amoxicillin-clavulanate (AUGMENTIN) 875-125 MG per tablet 1 tablet  1 tablet Oral Q12H Sital Mody, MD      . aspirin chewable tablet 81 mg  81 mg Oral Daily Auburn Bilberry, MD   81 mg at 05/17/15 0944  . Influenza vac split quadrivalent PF (FLUARIX) injection 0.5 mL  0.5 mL Intramuscular Tomorrow-1000 Auburn Bilberry, MD      . lisinopril (PRINIVIL,ZESTRIL) tablet 2.5 mg  2.5 mg Oral Daily Adrian Saran, MD   2.5 mg at 05/17/15 0944  . loratadine (CLARITIN) tablet 10 mg  10 mg Oral Daily Auburn Bilberry, MD   10 mg at 05/17/15 0944  . metoprolol tartrate (LOPRESSOR) tablet 12.5 mg  12.5 mg Oral BID Auburn Bilberry, MD   12.5 mg at 05/17/15 0946  . montelukast (SINGULAIR) tablet 10 mg  10 mg Oral Daily Auburn Bilberry, MD  10 mg at 05/17/15 0944  . nystatin cream (MYCOSTATIN) 1 application  1 application Topical TID PRN Auburn BilberryShreyang Patel, MD   1 application at 05/16/15 0417  . ondansetron (ZOFRAN) tablet 4 mg  4 mg Oral Q6H PRN Auburn BilberryShreyang Patel, MD   4 mg at 05/16/15 1006   Or  . ondansetron (ZOFRAN) injection 4 mg  4 mg Intravenous Q6H PRN Auburn BilberryShreyang Patel, MD      . pantoprazole (PROTONIX) EC tablet 40 mg  40 mg Oral Daily Auburn BilberryShreyang Patel, MD   40 mg at 05/17/15 0944  . simvastatin (ZOCOR) tablet 10 mg  10 mg Oral QHS Auburn BilberryShreyang Patel, MD   10 mg at 05/16/15 2107   Do not use this list as official medication orders. Please verify with discharge summary.  Discharge Medications:   Medication List    TAKE these  medications        amoxicillin-clavulanate 875-125 MG tablet  Commonly known as:  AUGMENTIN  Take 1 tablet by mouth 2 (two) times daily.      ASK your doctor about these medications        aspirin EC 81 MG tablet  Take 81 mg by mouth daily.     cetirizine 10 MG tablet  Commonly known as:  ZYRTEC  Take 10 mg by mouth at bedtime.     lisinopril 2.5 MG tablet  Commonly known as:  PRINIVIL,ZESTRIL  Take 2.5 mg by mouth daily.     metoprolol tartrate 25 MG tablet  Commonly known as:  LOPRESSOR  Take 12.5 mg by mouth 2 (two) times daily.     montelukast 10 MG tablet  Commonly known as:  SINGULAIR  Take 10 mg by mouth daily.     naproxen sodium 220 MG tablet  Commonly known as:  ANAPROX  Take 220 mg by mouth 2 (two) times daily.     nystatin cream  Commonly known as:  MYCOSTATIN  Apply 1 application topically 3 (three) times daily as needed (for itching/irritation).     pantoprazole 40 MG tablet  Commonly known as:  PROTONIX  Take 1 tablet (40 mg total) by mouth daily.     simvastatin 10 MG tablet  Commonly known as:  ZOCOR  Take 10 mg by mouth at bedtime.        Relevant Imaging Results:  Relevant Lab Results:  Recent Labs    Additional Information SS: [id number]  York SpanielMonica Hakim Minn[place]ield, LCSW

## 2015-05-17 NOTE — Progress Notes (Signed)
PT Cancellation Note  Patient Details Name: Anna [name] MRN: [id number] DOB: 09-05-1921   Cancelled Treatment:    Reason Eval/Treat Not Completed: Fatigue/lethargy limiting ability to participate (Treatment session attempted.  Caregiver at bedside reports patient just returned from CT and "just got to resting".  Requests therapist allow patient to rest for now and re-attempt at later time as able.  Will continue efforts at later time/date.)  Octivia Canion H. Manson PasseyBrown, PT, DPT, NCS 05/17/2015, 10:59 AM [phone]

## 2015-05-17 NOTE — Progress Notes (Signed)
[facility]   PATIENT NAME: Anna SingletonDoris Hettinger    MR#:  [id number]  DATE OF BIRTH:  14-Aug-1921  SUBJECTIVE:  Family bedside. They were worried that patient may have suffered a TIA prior to being admitted to the hospital as she was very weak and they felt that she was slumped over. Patient exhibits no new neurological deficits. Venetia MaxonStern for poor by mouth intake.  REVIEW OF SYSTEMS:    Review of Systems  Constitutional: Positive for malaise/fatigue. Negative for fever and chills.  HENT: Negative for sore throat.   Eyes: Negative for blurred vision.  Respiratory: Negative for cough, hemoptysis, shortness of breath and wheezing.   Cardiovascular: Negative for chest pain, palpitations and leg swelling.  Gastrointestinal: Negative for nausea, vomiting, abdominal pain, diarrhea and blood in stool.       Poor appetite  Genitourinary: Negative for dysuria.  Musculoskeletal: Negative for back pain.  Neurological: Positive for focal weakness and weakness. Negative for dizziness, tremors and headaches.  Endo/Heme/Allergies: Does not bruise/bleed easily.    Tolerating Diet:yes      DRUG ALLERGIES:   Allergies  Allergen Reactions  . Ciprofloxacin Nausea And Vomiting  . Macrobid [Nitrofurantoin Monohydrate Macrocrystals] Nausea And Vomiting    VITALS:  Blood pressure 141/55, pulse 88, temperature 97.5 F (36.4 C), temperature source Oral, resp. rate 18, height 5\' 1"  (1.549 m), weight 59.966 kg (132 lb 3.2 oz), SpO2 97 %.  PHYSICAL EXAMINATION:   Physical Exam  Constitutional: She is well-developed, well-nourished, and in no distress. No distress.  HENT:  Head: Normocephalic.  Eyes: No scleral icterus.  Neck: Normal range of motion. Neck supple. No JVD present. No tracheal deviation present.  Cardiovascular: Normal rate and regular rhythm.  Exam reveals no gallop and no friction rub.   Murmur heard. Pulmonary/Chest: Effort normal and breath  sounds normal. No respiratory distress. She has no wheezes. She has no rales. She exhibits no tenderness.  Abdominal: Soft. Bowel sounds are normal. She exhibits no distension and no mass. There is no rebound and no guarding.  Musculoskeletal: Normal range of motion. She exhibits no edema.  Neurological: She is alert.  Right sided weakness and mild aphasia at baseline from previous CVA  Skin: Skin is warm. No rash noted. No erythema.      LABORATORY PANEL:   CBC  Recent Labs Lab 05/17/15 0455  WBC 4.0  HGB 10.1*  HCT 32.2*  PLT 213   ------------------------------------------------------------------------------------------------------------------  Chemistries   Recent Labs Lab 05/15/15 1459  05/17/15 0455  NA 140  < > 142  K 3.1*  < > 4.3  CL 103  < > 113*  CO2 25  < > 23  GLUCOSE 116*  < > 70  BUN 24*  < > 14  CREATININE 0.95  < > 0.74  CALCIUM 8.8*  < > 7.7*  AST 14*  --   --   ALT 7*  --   --   ALKPHOS 75  --   --   BILITOT 0.8  --   --   < > = values in this interval not displayed. ------------------------------------------------------------------------------------------------------------------  Cardiac Enzymes  Recent Labs Lab 05/15/15 1459  TROPONINI <0.03   ------------------------------------------------------------------------------------------------------------------  RADIOLOGY:  Dg Chest 2 View  05/15/2015  CLINICAL DATA:  81 year old female with shortness of breath today with getting in and out of a car. EXAM: CHEST  2 VIEW COMPARISON:  Chest x-ray 08/17/2013. FINDINGS: Mild diffuse peribronchial cuffing. Lung  volumes are low. No consolidative airspace disease. No pleural effusions. No pneumothorax. No evidence of pulmonary edema. Heart size is borderline enlarged, similar to the prior examination. Upper mediastinal contours are within normal limits allowing for patient rotation to the left. Atherosclerosis in the thoracic aorta. IMPRESSION: 1. Mild  diffuse peribronchial cuffing suggestive of acute bronchitis. 2. Atherosclerosis. Electronically Signed   By: Trudie Reed M.D.   On: 05/15/2015 15:58   Ct Head Wo Contrast  05/17/2015  CLINICAL DATA:  81 year old female with generalized weakness and decreased oral intake over the past week EXAM: CT HEAD WITHOUT CONTRAST TECHNIQUE: Contiguous axial images were obtained from the base of the skull through the vertex without intravenous contrast. COMPARISON:  Prior CT scan of the head 08/17/2013 FINDINGS: Negative for acute intracranial hemorrhage, acute infarction, mass, mass effect, hydrocephalus or midline shift. Gray-white differentiation is preserved throughout. Confluent periventricular and subcortical white matter hypoattenuation most consistent with advanced chronic microvascular ischemic white matter disease. Circumscribed hypoattenuation in the left basal ganglia extending superiorly into the corona radiata consistent with a remote ischemic insult. This is a new finding compared to 08/17/2013 and has likely occurred at some point in the interim. No focal soft tissue or calvarial abnormality. Normal aeration of the bilateral mastoid air cells and the majority of the paranasal sinuses save for the left sphenoid air cell. Atherosclerotic calcifications in both cavernous carotid arteries. IMPRESSION: 1. No acute intracranial abnormality. 2. Advanced chronic microvascular ischemic white matter disease. 3. Focal encephalomalacia consistent with prior ischemic insult in the left basal ganglia extending superiorly into the corona radiata does not appear acute or subacute but is a new finding compared to 08/17/2013 and has likely occurred at some point in the interim since the prior study. 4. Partial opacification of the left sphenoid air cell. 5. Intracranial atherosclerosis. Electronically Signed   By: Malachy Moan M.D.   On: 05/17/2015 10:42   Ct Abdomen Pelvis W Contrast  05/15/2015  CLINICAL DATA:   81 year old female with history of abdominal pain and diarrhea for the past 5 days. Prior history of bowel obstruction. EXAM: CT ABDOMEN AND PELVIS WITH CONTRAST TECHNIQUE: Multidetector CT imaging of the abdomen and pelvis was performed using the standard protocol following bolus administration of intravenous contrast. CONTRAST:  75mL OMNIPAQUE IOHEXOL 300 MG/ML  SOLN COMPARISON:  No priors. FINDINGS: Lower chest: Scarring in the lung bases bilaterally. Atherosclerotic calcifications in the left circumflex coronary artery. Calcifications of the aortic valve. Hepatobiliary: Several sub cm low-attenuation liver lesions are noted, too small to definitively characterize but favored to represent small cysts. Mild periportal edema. No intra or extrahepatic biliary ductal dilatation. Status post cholecystectomy. Pancreas: No pancreatic mass. No pancreatic ductal dilatation. No pancreatic or peripancreatic fluid or inflammatory changes. Severe atrophy of the distal body and tail of the pancreas. Spleen: Unremarkable. Adrenals/Urinary Tract: Cortical thinning in the upper pole of the right kidney, presumably post infectious or inflammatory scarring. There are some tiny parenchymal calcifications in this region which are likely dystrophic. Sub cm low-attenuation lesion in the lateral aspect of the upper pole of the left kidney is too small to definitively characterize, but is statistically likely a small cyst. No hydroureteronephrosis. Urinary bladder is normal in appearance. Stomach/Bowel: Normal appearance of the stomach. No pathologic dilatation of small bowel or colon. Numerous colonic diverticular noted. Importantly, there are extensive inflammatory changes adjacent to the distal descending colon and proximal sigmoid colon in the a region of numerous diverticuli, where there is also extensive colonic  wall thickening, presumably secondary to acute diverticulitis. No discrete diverticular abscess is confidently identified  at this time. No signs of frank perforation. Vascular/Lymphatic: Extensive atherosclerosis throughout the abdominal and pelvic vasculature, without evidence of aneurysm or dissection. No lymphadenopathy noted in the abdomen or pelvis. Reproductive: Uterus and ovaries are atrophic. Dilated left gonadal vein incidentally noted. Other: Trace volume of ascites in the low anatomic pelvis, presumably reactive. No larger volume of ascites. No pneumoperitoneum. Musculoskeletal: There are no aggressive appearing lytic or blastic lesions noted in the visualized portions of the skeleton. IMPRESSION: 1. Colonic diverticulitis at the junction the distal descending colon and proximal sigmoid colon. No discrete diverticular abscess is identified at this time. No signs of frank perforation. Given the extensive colonic wall thickening in this region (which is favored to be chronic related to chronic diverticular disease), a followup nonemergent colonoscopy should be considered in the near future after complete resolution of the patient's acute symptoms to exclude the possibility of concurrent colonic neoplasm. 2. Mild diffuse periportal edema. This is of uncertain etiology and significance, but correlation with liver function tests is suggested. 3. Status post cholecystectomy. 4. Extensive atherosclerosis. 5. Additional incidental findings, as above. Electronically Signed   By: Trudie Reed M.D.   On: 05/15/2015 18:49     ASSESSMENT AND PLAN:   81 year old female who presented with generalized weakness and found to have acute diverticulitis.  1. Acute colonic diverticulitis: Patient will be changed to oral Augmentin. She has an allergy to ciprofloxacin.   2. Diarrhea: Diarrhea seems to have resolved.   3. History of CVA: Patient continues have right-sided paralysis. Continue aspirin. CT head does not show acute CVA.   4. Essential hypertension: Continue lisinopril  5. Hyperlipidemia: Continue  simvastatin      CODE STATUS: FULL  TOTAL TIME TAKING CARE OF THIS PATIENT: 25 minutes.    Discussed with family at bedside POSSIBLE D/C tomorrow, DEPENDING ON CLINICAL CONDITION.   Sheanna Dail M.D on 05/17/2015 at 12:41 PM  Between 7am to 6pm - Pager - [phone] After 6pm go to [email or web address] - password [email or web address]  [facility]  Office  [phone]  CC: Primary care physician; Wynona Dove, MD  Note: This dictation was prepared with Dragon dictation along with smaller phrase technology. Any transcriptional errors that result from this process are unintentional.

## 2015-05-18 DIAGNOSIS — K5732 Diverticulitis of large intestine without perforation or abscess without bleeding: Secondary | ICD-10-CM | POA: Diagnosis not present

## 2015-05-18 LAB — CBC
HEMATOCRIT: 32 % — AB (ref 35.0–47.0)
Hemoglobin: 9.9 g/dL — ABNORMAL LOW (ref 12.0–16.0)
MCH: 22.5 pg — ABNORMAL LOW (ref 26.0–34.0)
MCHC: 30.9 g/dL — ABNORMAL LOW (ref 32.0–36.0)
MCV: 72.7 fL — AB (ref 80.0–100.0)
Platelets: 217 10*3/uL (ref 150–440)
RBC: 4.4 MIL/uL (ref 3.80–5.20)
RDW: 17.1 % — ABNORMAL HIGH (ref 11.5–14.5)
WBC: 4.2 10*3/uL (ref 3.6–11.0)

## 2015-05-18 LAB — STOOL CULTURE

## 2015-05-18 LAB — BASIC METABOLIC PANEL
ANION GAP: 1 — AB (ref 5–15)
BUN: 10 mg/dL (ref 6–20)
CHLORIDE: 113 mmol/L — AB (ref 101–111)
CO2: 25 mmol/L (ref 22–32)
Calcium: 7.5 mg/dL — ABNORMAL LOW (ref 8.9–10.3)
Creatinine, Ser: 0.68 mg/dL (ref 0.44–1.00)
GFR calc Af Amer: >60 mL/min (ref >60)
GFR calc non Af Amer: >60 mL/min (ref >60)
GLUCOSE: 85 mg/dL (ref 65–99)
POTASSIUM: 4.4 mmol/L (ref 3.5–5.1)
Sodium: 139 mmol/L (ref 135–145)

## 2015-05-18 MED ORDER — CEFUROXIME AXETIL 250 MG/5ML PO SUSR: 250.0000 mg | Freq: Two times a day (BID) | ORAL | Status: DC

## 2015-05-18 MED ORDER — AMOXICILLIN-POT CLAVULANATE 400-57 MG/5ML PO SUSR
800.0000 mg | Freq: Two times a day (BID) | ORAL | Status: DC
  Administered 2015-05-19: 800 mg via ORAL
  Filled 2015-05-18 (×4): qty 10

## 2015-05-18 MED ORDER — AMOXICILLIN-POT CLAVULANATE 400-57 MG/5ML PO SUSR: 800.0000 mg | Freq: Two times a day (BID) | ORAL | Status: DC

## 2015-05-18 MED ORDER — CEFUROXIME AXETIL 500 MG PO TABS
250.0000 mg | ORAL_TABLET | Freq: Two times a day (BID) | ORAL | Status: DC
  Filled 2015-05-18: qty 1

## 2015-05-18 MED ORDER — CEFUROXIME AXETIL 250 MG/5ML PO SUSR
250.0000 mg | Freq: Two times a day (BID) | ORAL | Status: DC
  Administered 2015-05-18 – 2015-05-19 (×2): 250 mg via ORAL
  Filled 2015-05-18 (×4): qty 5

## 2015-05-18 NOTE — Discharge Summary (Signed)
[facility] Physicians - [place] at [facility]   PATIENT NAME: Anna [name]    MR#:  [id number]  DATE OF BIRTH:  [birth year]  DATE OF ADMISSION:  05/15/2015 ADMITTING PHYSICIAN: Auburn Bilberry, MD  DATE OF DISCHARGE: 05/18/2015    PRIMARY CARE PHYSICIAN: Wynona Dove, MD    ADMISSION DIAGNOSIS:  Diverticulitis of large intestine without perforation or abscess without bleeding [K57.32] Diarrhea, unspecified type [R19.7]  DISCHARGE DIAGNOSIS:  Active Problems:   Diverticulitis   SECONDARY DIAGNOSIS:   Past Medical History  Diagnosis Date  . Hypertension   . GERD (gastroesophageal reflux disease)   . Allergy   . Hyperlipidemia   . Anxiety   . Osteoporosis   . Arthritis     DEGENERATIVE; AFFECTING R KNEE  . Palpitations   . Lower GI bleed 2005 2009    SECONDARY TO DIVERTICULOSIS  . Hyperglycemia   . Recurrent UTI   . Recurrent aspiration bronchitis/pneumonia (HCC)   . Diverticulosis   . Bladder prolapse   . CVA (cerebral infarction)     HOSPITAL COURSE:  81 year old female who presented with generalized weakness and found to have acute diverticulitis.  1. Acute colonic diverticulitis: Patient was on IV Unasyn and is now on suspension oral Augmentin. She has an allergy to ciprofloxacin.   2. Diarrhea: Diarrhea seems to have resolved.   3. 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 She needs to have aspiration precautions. Continue aspirin. CT head does not show acute CVA.   4. Essential hypertension: Continue lisinopril  5. Hyperlipidemia: Continue simvastatin  6. UTI no hematuria: Continue oral suspension of CEFTIN   DISCHARGE CONDITIONS AND DIET:  Stable condition Dysphagia 3 (Mech soft);Thin   Medication Administration: Whole meds with puree Compensations: Minimize environmental distractions;Slow rate;Small sips/bites  CONSULTS OBTAINED:  Treatment Team:  Auburn Bilberry,  MD  DRUG ALLERGIES:   Allergies  Allergen Reactions  . Ciprofloxacin Nausea And Vomiting  . Macrobid [Nitrofurantoin Monohydrate Macrocrystals] Nausea And Vomiting    DISCHARGE MEDICATIONS:   Current Discharge Medication List    START taking these medications   Details  amoxicillin-clavulanate (AUGMENTIN) 400-57 MG/5ML suspension Take 10 mLs (800 mg total) by mouth every 12 (twelve) hours. Qty: 100 mL, Refills: 0    cefUROXime (CEFTIN) 250 MG/5ML suspension Take 5 mLs (250 mg total) by mouth every 12 (twelve) hours. Qty: 100 mL, Refills: 0      CONTINUE these medications which have NOT CHANGED   Details  aspirin EC 81 MG tablet Take 81 mg by mouth daily.      cetirizine (ZYRTEC) 10 MG tablet Take 10 mg by mouth at bedtime.    lisinopril (PRINIVIL,ZESTRIL) 2.5 MG tablet Take 2.5 mg by mouth daily.    metoprolol tartrate (LOPRESSOR) 25 MG tablet Take 12.5 mg by mouth 2 (two) times daily.    montelukast (SINGULAIR) 10 MG tablet Take 10 mg by mouth daily.    nystatin cream (MYCOSTATIN) Apply 1 application topically 3 (three) times daily as needed (for itching/irritation).    pantoprazole (PROTONIX) 40 MG tablet Take 1 tablet (40 mg total) by mouth daily. Qty: 30 tablet, Refills: 6   Associated Diagnoses: GERD (gastroesophageal reflux disease)    simvastatin (ZOCOR) 10 MG tablet Take 10 mg by mouth at bedtime.      STOP taking these medications     naproxen sodium (ANAPROX) 220 MG tablet  Today   CHIEF COMPLAINT:  Patient wants to go home   VITAL SIGNS:  Blood pressure 150/69, pulse 75, temperature 98.1 F (36.7 C), temperature source Oral, resp. rate 22, height  (1.549 m), weight 59.966 kg (132 lb 3.2 oz), SpO2 95 %.   REVIEW OF SYSTEMS:  Review of Systems  Constitutional: Positive for malaise/fatigue. Negative for fever and chills.  HENT: Negative for sore throat.   Eyes: Negative for blurred vision.  Respiratory: Negative for  cough, hemoptysis, shortness of breath and wheezing.   Cardiovascular: Negative for chest pain, palpitations and leg swelling.  Gastrointestinal: Negative for nausea, vomiting, abdominal pain, diarrhea and blood in stool.  Genitourinary: Negative for dysuria.  Musculoskeletal: Negative for back pain.  Neurological: Positive for speech change, focal weakness and weakness. Negative for dizziness, tremors and headaches.  Endo/Heme/Allergies: Does not bruise/bleed easily.     PHYSICAL EXAMINATION:  GENERAL:  81 y.o.-year-old patient lying in the bed with no acute distress.  NECK:  Supple, no jugular venous distention. No thyroid enlargement, no tenderness.  LUNGS: Normal breath sounds bilaterally, no wheezing, rales,rhonchi  No use of accessory muscles of respiration.  CARDIOVASCULAR: S1, S2 normal. No murmurs, rubs, or gallops.  ABDOMEN: Soft, non-tender, non-distended. Bowel sounds present. No organomegaly or mass.  EXTREMITIES: No pedal edema, cyanosis, or clubbing.  PSYCHIATRIC: The patient is alert and oriented x 3.  SKIN: No obvious rash, lesion, or ulcer.  NEURO: baseline right sided weakness and aphasia nothing new  DATA REVIEW:   CBC  Recent Labs Lab 05/18/15 0433  WBC 4.2  HGB 9.9*  HCT 32.0*  PLT 217    Chemistries   Recent Labs Lab 05/15/15 1459  05/18/15 0433  NA 140  < > 139  K 3.1*  < > 4.4  CL 103  < > 113*  CO2 25  < > 25  GLUCOSE 116*  < > 85  BUN 24*  < > 10  CREATININE 0.95  < > 0.68  CALCIUM 8.8*  < > 7.5*  AST 14*  --   --   ALT 7*  --   --   ALKPHOS 75  --   --   BILITOT 0.8  --   --   < > = values in this interval not displayed.  Cardiac Enzymes  Recent Labs Lab 05/15/15 1459  TROPONINI <0.03    Microbiology Results  @  RADIOLOGY:  Ct Head Wo Contrast  05/17/2015  CLINICAL DATA:  81 year old female with generalized weakness and decreased oral intake over the past week EXAM: CT HEAD WITHOUT CONTRAST TECHNIQUE:  Contiguous axial images were obtained from the base of the skull through the vertex without intravenous contrast. COMPARISON:  Prior CT scan of the head 08/17/2013 FINDINGS: Negative for acute intracranial hemorrhage, acute infarction, mass, mass effect, hydrocephalus or midline shift. Gray-white differentiation is preserved throughout. Confluent periventricular and subcortical white matter hypoattenuation most consistent with advanced chronic microvascular ischemic white matter disease. Circumscribed hypoattenuation in the left basal ganglia extending superiorly into the corona radiata consistent with a remote ischemic insult. This is a new finding compared to 08/17/2013 and has likely occurred at some point in the interim. No focal soft tissue or calvarial abnormality. Normal aeration of the bilateral mastoid air cells and the majority of the paranasal sinuses save for the left sphenoid air cell. Atherosclerotic calcifications in both cavernous carotid arteries. IMPRESSION: 1. No acute intracranial abnormality. 2. Advanced chronic microvascular ischemic white matter disease. 3. Focal  encephalomalacia consistent with prior ischemic insult in the left basal ganglia extending superiorly into the corona radiata does not appear acute or subacute but is a new finding compared to 08/17/2013 and has likely occurred at some point in the interim since the prior study. 4. Partial opacification of the left sphenoid air cell. 5. Intracranial atherosclerosis. Electronically Signed   By: Malachy MoanHeath  McCullough M.D.   On: 05/17/2015 10:42      Management plans discussed with the patient's daughter and she is in agreement. Stable for discharge   Patient should follow up with PCP in 1 week  CODE STATUS:     Code Status Orders        Start     Ordered   05/15/15 2221  Full code   Continuous     05/15/15 2220      TOTAL TIME TAKING CARE OF THIS PATIENT: 35 minutes.    Note: This dictation was prepared with Dragon  dictation along with smaller phrase technology. Any transcriptional errors that result from this process are unintentional.  Avraham Benish M.D on 05/18/2015 at 9:52 AM  Between 7am to 6pm - Pager - [phone] After 6pm go to [email or web address] - password [email or web address] [facility]  [facility]  Office  [phone]  CC: Primary care physician; Wynona DoveWALKER,JENNIFER AZBELL, MD

## 2015-05-18 NOTE — Progress Notes (Signed)
CSW informed by MD that patient can discharge when SNF bed is available.  Insurance auth submitted Friday afternoon.  CSW presented bed offers to patient's dau.  Dau's both refusing to discharge until insurance Berkley Harveyauth is received.  States they are unable to private pay.  CSW informed Consulting civil engineerCharge RN and patient's daughter also informed Consulting civil engineercharge RN.  Possible discharge on Monday.  Sammuel Hineseborah Shela Esses. Theresia MajorsLCSWA, MSW Clinical Social Work Department [phone] 11:47 AM

## 2015-05-19 MED ORDER — AMOXICILLIN-POT CLAVULANATE 875-125 MG PO TABS
1.0000 | ORAL_TABLET | Freq: Two times a day (BID) | ORAL | Status: DC
  Administered 2015-05-20: 1 via ORAL
  Filled 2015-05-19 (×2): qty 1

## 2015-05-19 MED ORDER — CEFUROXIME AXETIL 500 MG PO TABS
250.0000 mg | ORAL_TABLET | Freq: Two times a day (BID) | ORAL | Status: AC
  Filled 2015-05-19: qty 1

## 2015-05-19 NOTE — Progress Notes (Signed)
[facility] Physicians - [place] at [facility]   PATIENT NAME: Anna [name]    MR#:  [id number]  DATE OF BIRTH:  15-Jun-1922  SUBJECTIVE:  Family of bedside this morning. Patient reports that she wants to go home. She is weak. She is eating and more alert this morning.  REVIEW OF SYSTEMS:    Review of Systems  Constitutional: Positive for malaise/fatigue. Negative for fever and chills.  HENT: Negative for sore throat.   Eyes: Negative for blurred vision.  Respiratory: Negative for cough, hemoptysis, shortness of breath and wheezing.   Cardiovascular: Negative for chest pain, palpitations and leg swelling.  Gastrointestinal: Negative for nausea, vomiting, abdominal pain, diarrhea and blood in stool.  Genitourinary: Negative for dysuria.  Musculoskeletal: Negative for back pain.  Neurological: Positive for focal weakness and weakness. Negative for dizziness, tremors and headaches.  Endo/Heme/Allergies: Does not bruise/bleed easily.    Tolerating Diet:yes eating what she can      DRUG ALLERGIES:   Allergies  Allergen Reactions  . Ciprofloxacin Nausea And Vomiting  . Macrobid [Nitrofurantoin Monohydrate Macrocrystals] Nausea And Vomiting    VITALS:  Blood pressure 135/76, pulse 91, temperature 97.8 F (36.6 C), temperature source Axillary, resp. rate 18, height  (1.549 m), weight 59.966 kg (132 lb 3.2 oz), SpO2 96 %.  PHYSICAL EXAMINATION:   Physical Exam  Constitutional: She is well-developed, well-nourished, and in no distress. No distress.  HENT:  Head: Normocephalic.  Eyes: No scleral icterus.  Neck: Normal range of motion. Neck supple. No JVD present. No tracheal deviation present.  Cardiovascular: Normal rate and regular rhythm.  Exam reveals no gallop and no friction rub.   Murmur heard. Pulmonary/Chest: Effort normal and breath sounds normal. No respiratory distress. She has no wheezes. She has no rales. She exhibits no tenderness.   Abdominal: Soft. Bowel sounds are normal. She exhibits no distension and no mass. There is no rebound and no guarding.  Musculoskeletal: Normal range of motion. She exhibits no edema.  Neurological: She is alert.  Right sided weakness and mild aphasia at baseline from previous CVA  Skin: Skin is warm. No rash noted. No erythema.      LABORATORY PANEL:   CBC  Recent Labs Lab 05/18/15 0433  WBC 4.2  HGB 9.9*  HCT 32.0*  PLT 217   ------------------------------------------------------------------------------------------------------------------  Chemistries   Recent Labs Lab 05/15/15 1459  05/18/15 0433  NA 140  < > 139  K 3.1*  < > 4.4  CL 103  < > 113*  CO2 25  < > 25  GLUCOSE 116*  < > 85  BUN 24*  < > 10  CREATININE 0.95  < > 0.68  CALCIUM 8.8*  < > 7.5*  AST 14*  --   --   ALT 7*  --   --   ALKPHOS 75  --   --   BILITOT 0.8  --   --   < > = values in this interval not displayed. ------------------------------------------------------------------------------------------------------------------  Cardiac Enzymes  Recent Labs Lab 05/15/15 1459  TROPONINI <0.03   ------------------------------------------------------------------------------------------------------------------  RADIOLOGY:  No results found.   ASSESSMENT AND PLAN:   81 year old female who presented with generalized weakness and found to have acute diverticulitis.  1. Acute colonic diverticulitis: Patient is on oral Augmentin. She has an allergy to ciprofloxacin. She will need treatment until 05/27/2015  2. Diarrhea: Diarrhea seems to have resolved.   3. History of CVA: Patient continues have right-sided paralysis. Continue  aspirin. CT head does not show acute CVA.   4. Essential hypertension: Continue lisinopril  5. Hyperlipidemia: Continue simvastatin  6. Urinary tract infection: Continue Ceftin through 05/25/2015  Patient will need to go to skilled nursing facility. Awaiting  insurance approval   CODE STATUS: FULL  TOTAL TIME TAKING CARE OF THIS PATIENT: 25 minutes.    Discussed with family at bedside POSSIBLE D/C tomorrow,   Casanova Schurman M.D on 05/19/2015 at 11:25 AM  Between 7am to 6pm - Pager - [phone] After 6pm go to [email or web address] - password [email or web address]  [facility] [place] [facility]  Office  [phone]  CC: Primary care physician; Wynona DoveWALKER,JENNIFER AZBELL, MD  Note: This dictation was prepared with Dragon dictation along with smaller phrase technology. Any transcriptional errors that result from this process are unintentional.

## 2015-05-19 NOTE — Discharge Summary (Deleted)
[facility] Physicians - [place] at [facility]   PATIENT NAME: Anna [name]    MR#:  [id number]  DATE OF BIRTH:  Jul 18, 1921  DATE OF ADMISSION:  05/15/2015 ADMITTING PHYSICIAN: Auburn Bilberry, MD  DATE OF DISCHARGE: 05/18/2015    PRIMARY CARE PHYSICIAN: Wynona Dove, MD    ADMISSION DIAGNOSIS:  Diverticulitis of large intestine without perforation or abscess without bleeding [K57.32] Diarrhea, unspecified type [R19.7]  DISCHARGE DIAGNOSIS:  Active Problems:   Diverticulitis   SECONDARY DIAGNOSIS:   Past Medical History  Diagnosis Date  . Hypertension   . GERD (gastroesophageal reflux disease)   . Allergy   . Hyperlipidemia   . Anxiety   . Osteoporosis   . Arthritis     DEGENERATIVE; AFFECTING R KNEE  . Palpitations   . Lower GI bleed 2005 2009    SECONDARY TO DIVERTICULOSIS  . Hyperglycemia   . Recurrent UTI   . Recurrent aspiration bronchitis/pneumonia (HCC)   . Diverticulosis   . Bladder prolapse   . CVA (cerebral infarction)     HOSPITAL COURSE:  81 year old female who presented with generalized weakness and found to have acute diverticulitis.  1. Acute colonic diverticulitis: Patient was on IV Unasyn and is now on suspension oral Augmentin. She has an allergy to ciprofloxacin.   2. Diarrhea: Diarrhea seems to have resolved.   3. 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 She needs to have aspiration precautions. Continue aspirin. CT head does not show acute CVA.   4. Essential hypertension: Continue lisinopril  5. Hyperlipidemia: Continue simvastatin  6. UTI no hematuria: Continue oral suspension of CEFTIN   DISCHARGE CONDITIONS AND DIET:  Stable condition Dysphagia 3 (Mech soft);Thin   Medication Administration: Whole meds with puree Compensations: Minimize environmental distractions;Slow rate;Small sips/bites  CONSULTS OBTAINED:  Treatment Team:  Auburn Bilberry,  MD  DRUG ALLERGIES:   Allergies  Allergen Reactions  . Ciprofloxacin Nausea And Vomiting  . Macrobid [Nitrofurantoin Monohydrate Macrocrystals] Nausea And Vomiting    DISCHARGE MEDICATIONS:   Current Discharge Medication List    START taking these medications   Details  amoxicillin-clavulanate (AUGMENTIN) 400-57 MG/5ML suspension Take 10 mLs (800 mg total) by mouth every 12 (twelve) hours. Qty: 100 mL, Refills: 0    cefUROXime (CEFTIN) 250 MG/5ML suspension Take 5 mLs (250 mg total) by mouth every 12 (twelve) hours. Qty: 100 mL, Refills: 0      CONTINUE these medications which have NOT CHANGED   Details  aspirin EC 81 MG tablet Take 81 mg by mouth daily.      cetirizine (ZYRTEC) 10 MG tablet Take 10 mg by mouth at bedtime.    lisinopril (PRINIVIL,ZESTRIL) 2.5 MG tablet Take 2.5 mg by mouth daily.    metoprolol tartrate (LOPRESSOR) 25 MG tablet Take 12.5 mg by mouth 2 (two) times daily.    montelukast (SINGULAIR) 10 MG tablet Take 10 mg by mouth daily.    nystatin cream (MYCOSTATIN) Apply 1 application topically 3 (three) times daily as needed (for itching/irritation).    pantoprazole (PROTONIX) 40 MG tablet Take 1 tablet (40 mg total) by mouth daily. Qty: 30 tablet, Refills: 6   Associated Diagnoses: GERD (gastroesophageal reflux disease)    simvastatin (ZOCOR) 10 MG tablet Take 10 mg by mouth at bedtime.      STOP taking these medications     naproxen sodium (ANAPROX) 220 MG tablet  Today   CHIEF COMPLAINT:  Patient wants to go home   VITAL SIGNS:  Blood pressure 135/76, pulse 91, temperature 97.8 F (36.6 C), temperature source Axillary, resp. rate 18, height 5\' 1"  (1.549 m), weight 59.966 kg (132 lb 3.2 oz), SpO2 96 %.   REVIEW OF SYSTEMS:  Review of Systems  Constitutional: Positive for malaise/fatigue. Negative for fever and chills.  HENT: Negative for sore throat.   Eyes: Negative for blurred vision.  Respiratory: Negative  for cough, hemoptysis, shortness of breath and wheezing.   Cardiovascular: Negative for chest pain, palpitations and leg swelling.  Gastrointestinal: Negative for nausea, vomiting, abdominal pain, diarrhea and blood in stool.  Genitourinary: Negative for dysuria.  Musculoskeletal: Negative for back pain.  Neurological: Positive for speech change, focal weakness and weakness. Negative for dizziness, tremors and headaches.  Endo/Heme/Allergies: Does not bruise/bleed easily.     PHYSICAL EXAMINATION:  GENERAL:  81 y.o.-year-old patient lying in the bed with no acute distress.  NECK:  Supple, no jugular venous distention. No thyroid enlargement, no tenderness.  LUNGS: Normal breath sounds bilaterally, no wheezing, rales,rhonchi  No use of accessory muscles of respiration.  CARDIOVASCULAR: S1, S2 normal. No murmurs, rubs, or gallops.  ABDOMEN: Soft, non-tender, non-distended. Bowel sounds present. No organomegaly or mass.  EXTREMITIES: No pedal edema, cyanosis, or clubbing.  PSYCHIATRIC: The patient is alert and oriented x 3.  SKIN: No obvious rash, lesion, or ulcer.  NEURO: baseline right sided weakness and aphasia nothing new  DATA REVIEW:   CBC  Recent Labs Lab 05/18/15 0433  WBC 4.2  HGB 9.9*  HCT 32.0*  PLT 217    Chemistries   Recent Labs Lab 05/15/15 1459  05/18/15 0433  NA 140  < > 139  K 3.1*  < > 4.4  CL 103  < > 113*  CO2 25  < > 25  GLUCOSE 116*  < > 85  BUN 24*  < > 10  CREATININE 0.95  < > 0.68  CALCIUM 8.8*  < > 7.5*  AST 14*  --   --   ALT 7*  --   --   ALKPHOS 75  --   --   BILITOT 0.8  --   --   < > = values in this interval not displayed.  Cardiac Enzymes  Recent Labs Lab 05/15/15 1459  TROPONINI <0.03    Microbiology Results  @[email or web address] @  RADIOLOGY:  No results found.    Management plans discussed with the patient's daughter and she is in agreement. Stable for discharge   Patient should follow up with PCP in 1 week  CODE  STATUS:     Code Status Orders        Start     Ordered   05/15/15 2221  Full code   Continuous     05/15/15 2220      TOTAL TIME TAKING CARE OF THIS PATIENT: 35 minutes.    Note: This dictation was prepared with Dragon dictation along with smaller phrase technology. Any transcriptional errors that result from this process are unintentional.  Vishruth Seoane M.D on 05/19/2015 at 11:25 AM  Between 7am to 6pm - Pager - [phone] After 6pm go to [email or web address] - password [email or web address]  [facility] [place] [facility]  Office  [phone]  CC: Primary care physician; Wynona DoveWALKER,JENNIFER AZBELL, MD

## 2015-05-19 NOTE — Progress Notes (Signed)
Spoke with Dr. Juliene PinaMody about oral antibiotics on patient. Pt refuses to take meds in solution form. Okay to change over to pill form.

## 2015-05-20 ENCOUNTER — Encounter
Admission: RE | Admit: 2015-05-20 | Discharge: 2015-05-20 | Disposition: A | Discharge status: Home / Self Care | Payer: Medicare Other | Source: Ambulatory Visit | Attending: Internal Medicine | Admitting: Internal Medicine

## 2015-05-20 ENCOUNTER — Telehealth: Payer: Self-pay | Admitting: *Deleted

## 2015-05-20 MED ORDER — CEFUROXIME AXETIL 250 MG PO TABS: 250.0000 mg | ORAL_TABLET | Freq: Two times a day (BID) | ORAL | Status: DC

## 2015-05-20 MED ORDER — AMOXICILLIN-POT CLAVULANATE 875-125 MG PO TABS: 1.0000 | ORAL_TABLET | Freq: Two times a day (BID) | ORAL | Status: AC

## 2015-05-20 NOTE — Telephone Encounter (Signed)
I have not seen her since 2014. She has already seen Einar CrowMarshall Anderson, MD today.

## 2015-05-20 NOTE — Discharge Summary (Signed)
[facility] Physicians - [place] at [facility]   PATIENT NAME: Anna [name]    MR#:  [id number]  DATE OF BIRTH:  Apr 08, 1922  DATE OF ADMISSION:  05/15/2015 ADMITTING PHYSICIAN: Auburn Bilberry, MD  DATE OF DISCHARGE: 05/20/2015    PRIMARY CARE PHYSICIAN: Wynona Dove, MD    ADMISSION DIAGNOSIS:  Diverticulitis of large intestine without perforation or abscess without bleeding [K57.32] Diarrhea, unspecified type [R19.7]  DISCHARGE DIAGNOSIS:  Active Problems:   Diverticulitis   SECONDARY DIAGNOSIS:   Past Medical History  Diagnosis Date  . Hypertension   . GERD (gastroesophageal reflux disease)   . Allergy   . Hyperlipidemia   . Anxiety   . Osteoporosis   . Arthritis     DEGENERATIVE; AFFECTING R KNEE  . Palpitations   . Lower GI bleed 2005 2009    SECONDARY TO DIVERTICULOSIS  . Hyperglycemia   . Recurrent UTI   . Recurrent aspiration bronchitis/pneumonia (HCC)   . Diverticulosis   . Bladder prolapse   . CVA (cerebral infarction)     HOSPITAL COURSE:  81 year old female who presented with generalized weakness and found to have acute diverticulitis.  1. Acute colonic diverticulitis: Patient was on IV Unasyn and is now on  oral Augmentin. She has an allergy to ciprofloxacin.   2. Diarrhea: Diarrhea seems to have resolved.   3. 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 She needs to have aspiration precautions. Continue aspirin. CT head does not show acute CVA.   4. Essential hypertension: Continue lisinopril  5. Hyperlipidemia: Continue simvastatin  6. UTI no hematuria: Continue oral  CEFTIN   DISCHARGE CONDITIONS AND DIET:  Stable condition Dysphagia 3 (Mech soft);Thin   Medication Administration: Whole meds with puree Compensations: Minimize environmental distractions;Slow rate;Small sips/bites  CONSULTS OBTAINED:  Treatment Team:  Auburn Bilberry, MD  DRUG ALLERGIES:    Allergies  Allergen Reactions  . Ciprofloxacin Nausea And Vomiting  . Macrobid [Nitrofurantoin Monohydrate Macrocrystals] Nausea And Vomiting    DISCHARGE MEDICATIONS:   Current Discharge Medication List    START taking these medications   Details  amoxicillin-clavulanate (AUGMENTIN) 875-125 MG tablet Take 1 tablet by mouth every 12 (twelve) hours. Qty: 12 tablet, Refills: 0    cefUROXime (CEFTIN) 250 MG tablet Take 1 tablet (250 mg total) by mouth 2 (two) times daily. Qty: 8 tablet, Refills: 0      CONTINUE these medications which have NOT CHANGED   Details  aspirin EC 81 MG tablet Take 81 mg by mouth daily.      cetirizine (ZYRTEC) 10 MG tablet Take 10 mg by mouth at bedtime.    lisinopril (PRINIVIL,ZESTRIL) 2.5 MG tablet Take 2.5 mg by mouth daily.    metoprolol tartrate (LOPRESSOR) 25 MG tablet Take 12.5 mg by mouth 2 (two) times daily.    montelukast (SINGULAIR) 10 MG tablet Take 10 mg by mouth daily.    nystatin cream (MYCOSTATIN) Apply 1 application topically 3 (three) times daily as needed (for itching/irritation).    pantoprazole (PROTONIX) 40 MG tablet Take 1 tablet (40 mg total) by mouth daily. Qty: 30 tablet, Refills: 6   Associated Diagnoses: GERD (gastroesophageal reflux disease)    simvastatin (ZOCOR) 10 MG tablet Take 10 mg by mouth at bedtime.      STOP taking these medications     naproxen sodium (ANAPROX) 220 MG tablet               Today  CHIEF COMPLAINT:  No acute issues overnight. Family reports that she is eating better   VITAL SIGNS:  Blood pressure 142/88, pulse 106, temperature 99.1 F (37.3 C), temperature source Axillary, resp. rate 18, height 5\' 1"  (1.549 m), weight 59.966 kg (132 lb 3.2 oz), SpO2 98 %.   REVIEW OF SYSTEMS:  Review of Systems  Constitutional: Positive for malaise/fatigue. Negative for fever and chills.  HENT: Negative for sore throat.   Eyes: Negative for blurred vision.  Respiratory: Negative for  cough, hemoptysis, shortness of breath and wheezing.   Cardiovascular: Negative for chest pain, palpitations and leg swelling.  Gastrointestinal: Negative for nausea, vomiting, abdominal pain, diarrhea and blood in stool.  Genitourinary: Negative for dysuria.  Musculoskeletal: Negative for back pain.  Neurological: Positive for speech change, focal weakness and weakness. Negative for dizziness, tremors and headaches.  Endo/Heme/Allergies: Does not bruise/bleed easily.     PHYSICAL EXAMINATION:  GENERAL:  81 y.o.-year-old patient lying in the bed with no acute distress.  NECK:  Supple, no jugular venous distention. No thyroid enlargement, no tenderness.  LUNGS: Normal breath sounds bilaterally, no wheezing, rales,rhonchi  No use of accessory muscles of respiration.  CARDIOVASCULAR: S1, S2 normal. No murmurs, rubs, or gallops.  ABDOMEN: Soft, non-tender, non-distended. Bowel sounds present. No organomegaly or mass.  EXTREMITIES: No pedal edema, cyanosis, or clubbing.  PSYCHIATRIC: The patient is alert and oriented x 3.  SKIN: No obvious rash, lesion, or ulcer.  NEURO: baseline right sided weakness and aphasia nothing new  DATA REVIEW:   CBC  Recent Labs Lab 05/18/15 0433  WBC 4.2  HGB 9.9*  HCT 32.0*  PLT 217    Chemistries   Recent Labs Lab 05/15/15 1459  05/18/15 0433  NA 140  < > 139  K 3.1*  < > 4.4  CL 103  < > 113*  CO2 25  < > 25  GLUCOSE 116*  < > 85  BUN 24*  < > 10  CREATININE 0.95  < > 0.68  CALCIUM 8.8*  < > 7.5*  AST 14*  --   --   ALT 7*  --   --   ALKPHOS 75  --   --   BILITOT 0.8  --   --   < > = values in this interval not displayed.  Cardiac Enzymes  Recent Labs Lab 05/15/15 1459  TROPONINI <0.03    Microbiology Results  @[email or web address] @  RADIOLOGY:  No results found.    Management plans discussed with the patient's daughter and she is in agreement. Stable for discharge   Patient should follow up with PCP in 1 week  CODE STATUS:      Code Status Orders        Start     Ordered   05/15/15 2221  Full code   Continuous     05/15/15 2220      TOTAL TIME TAKING CARE OF THIS PATIENT: 33 minutes.    Note: This dictation was prepared with Dragon dictation along with smaller phrase technology. Any transcriptional errors that result from this process are unintentional.  Valerya Maxton M.D on 05/20/2015 at 11:42 AM  Between 7am to 6pm - Pager - [phone] After 6pm go to [email or web address] - password [email or web address] [facility]  Office  [phone]  CC: Primary care physician; Wynona DoveWALKER,JENNIFER AZBELL, MD

## 2015-05-20 NOTE — Telephone Encounter (Signed)
See last message from Dr. Dan HumphreysWalker.

## 2015-05-20 NOTE — Telephone Encounter (Signed)
Patient  will be D/C from the hospital today, please advise a place on Dr. Sonny DandyWalkers Schedule to place patient.

## 2015-05-20 NOTE — Clinical Social Work Note (Signed)
Patient's daughter has chosen [facility] (who could offer a semi-private for today). CSW has spoken to [place] with [facility] and Berkley Harveyauth: [phone] auth for 3 days. [facility]. Discharge packet sent via Epic. Nurse to call report.  York SpanielMonica Shany Marinez MSW,LCSW [phone]

## 2015-05-20 NOTE — Care Management Important Message (Signed)
Important Message  Patient Details  Name: Anna [name] MRN: [id number] Date of Birth: 1921-11-16   Medicare Important Message Given:  Yes-third notification given    Marily MemosLisa M Jerilynn Feldmeier, RN 05/20/2015, 12:54 PM

## 2015-05-20 NOTE — Telephone Encounter (Signed)
Please advise, appt time and date Anna [name]- TCM call

## 2015-05-20 NOTE — Telephone Encounter (Signed)
Noted  

## 2015-05-20 NOTE — Progress Notes (Signed)
Pt A and O x3. VSS. Pt incontinent and voiding with no difficulties. No complaints of pain or nausea. Tolerating diet with decreased appetite.Pt had IV removed tip intact. [facility]. Pt transported by EMS.

## 2015-05-20 NOTE — Clinical Social Work Placement (Signed)
   CLINICAL SOCIAL WORK PLACEMENT  NOTE  Date:  05/20/2015  Patient Details  Name: Anna [name] MRN: [id number] Date of Birth: 11-30-1921  Clinical Social Work is seeking post-discharge placement for this patient at the Skilled  Nursing Facility level of care (*CSW will initial, date and re-position this form in  chart as items are completed):  Yes   Patient/family provided with [facility] Social Work Department's list of facilities offering this level of care within the geographic area requested by the patient (or if unable, by the patient's family).  Yes   Patient/family informed of their freedom to choose among providers that offer the needed level of care, that participate in Medicare, Medicaid or managed care program needed by the patient, have an available bed and are willing to accept the patient.  Yes   Patient/family informed of [facility]'s ownership interest in [facility] and [facility], as well as of the fact that they are under no obligation to receive care at these facilities.  PASRR submitted to EDS on       PASRR number received on       Existing PASRR number confirmed on 05/17/15     FL2 transmitted to all facilities in geographic area requested by pt/family on 05/17/15     FL2 transmitted to all facilities within larger geographic area on       Patient informed that his/her managed care company has contracts with or will negotiate with certain facilities, including the following:        Yes   Patient/family informed of bed offers received.  Patient chooses bed at  [facility]     Physician recommends and patient chooses bed at  [facility])    Patient to be transferred to  [facility] on 05/20/15.  Patient to be transferred to facility by  (EMS)     Patient family notified on   of transfer.  Name of family member notified:   (daughter)     PHYSICIAN Please prepare priority discharge summary, including medications, Please sign FL2, Please  prepare prescriptions     Additional Comment:    _______________________________________________ York SpanielMonica Kashvi Prevette, LCSW 05/20/2015, 11:32 AM

## 2015-05-20 NOTE — Progress Notes (Signed)
Physical Therapy Treatment Patient Details Name: Anna [name] MRN: [id number] DOB: [birth year] Today's Date: 05/20/2015    History of Present Illness presented to ER from home secondary to weakness and general malaise, abdominal pain and diarrhea; admitted with diverticulitis.    PT Comments    Patient with improved alertness and active effort this date.  Continues to require mod/max assist for sit/stand and basic transfers, though able to complete with +1 (improved from +2 in previous session) this date.  Very delayed/absent righting reactions with weight shift to R of midline or in posterior direction, mod/max assist for recovery; patient at very high fall risk as result, as patient tends to attempt spontaneous sit vs. Balance correction throughout session with any LOB Balance in sitting much improved with placement of towel roll under R IT (for improved pelvic and trunk symmetry); would benefit from continued balance activities to promote L ant/lateral weight shift in both sitting an standing to promote optimal safety with mobility.   Follow Up Recommendations  SNF     Equipment Recommendations       Recommendations for Other Services       Precautions / Restrictions Precautions Precautions: Fall Restrictions Weight Bearing Restrictions: No    Mobility  Bed Mobility Overal bed mobility: Needs Assistance Bed Mobility: Supine to Sit     Supine to sit: Mod assist;Max assist     General bed mobility comments: improved ability to self-assist with L hemi-body this date  Transfers Overall transfer level: Needs assistance   Transfers: Sit to/from Stand;Stand Pivot Transfers Sit to Stand: Mod assist;Max assist Stand pivot transfers: Mod assist;Max assist       General transfer comment: constant manual facilitation for trunk, bilat hip extension and anterior weight translation with standing postures.  Frequent posterior LOB with noted delay in balance/righting  reactions-tends to spontaneously sit vs. attempt correction.  Ambulation/Gait             General Gait Details: non-ambulatory at baseline   Stairs            Wheelchair Mobility    Modified Rankin (Stroke Patients Only)       Balance Overall balance assessment: Needs assistance Sitting-balance support: No upper extremity supported;Feet supported Sitting balance-Leahy Scale: Fair Sitting balance - Comments: fluctuating from close sup to min/mod assist with divided attention or fucntional activity (for LOB to R); improved with wedge under R IT to promote symmetrical pelvic alignment and trunk control     Standing balance-Leahy Scale: Poor Standing balance comment: very forward flexed posture with marked delay in balance/righting reactions                    Cognition                            Exercises Other Exercises Other Exercises: Unsupported sitting balance edge of bed, close sup with focused/isolated attention to task (noted with excessive elongation of R lateral trunk, excessive R lateral trunk lean); min/mod assist for R lateral LOB with divided attention or integration of functional activity (grooming, oral care).  Patient with absent righting reactions with weight shift to R of midline.  Overall postural alignment and sitting balance improved with placement of pilllow/towel roll under R IT (for improved pelvic/postural alignment and L ant/lat weight shift) Other Exercises: Sit/stand x3 with L UE on IV pole for postural support once upright, mod/max assist--constant manual facilitation for trunk, bilat hip extension  and anterior weight translation with standing postures.  Frequent posterior LOB with noted delay in balance/righting reactions-tends to spontaneously sit vs. attempt correction. Other Exercises: SPT, bed/chair without assist device, mod/max assist--very slow weight shifting; ataxic R LE with increased time/visual input required for  correct placement.  Mod/max assist for extension of R hip/knee in periods of modified SLS during transfer.    General Comments        Pertinent Vitals/Pain Pain Assessment: No/denies pain    Home Living                      Prior Function            PT Goals (current goals can now be found in the care plan section) Acute Rehab PT Goals Patient Stated Goal: "to lay back down and rest" PT Goal Formulation: With patient/family Time For Goal Achievement: 05/30/15 Potential to Achieve Goals: Fair Progress towards PT goals: Progressing toward goals    Frequency  Min 2X/week    PT Plan Current plan remains appropriate    Co-evaluation             End of Session Equipment Utilized During Treatment: Gait belt Activity Tolerance: Patient tolerated treatment well Patient left: in chair;with call bell/phone within reach;with chair alarm set;with family/visitor present     Time: [id number] PT Time Calculation (min) (ACUTE ONLY): 32 min  Charges:  $Therapeutic Activity: 8-22 mins $Neuromuscular Re-education: 8-22 mins                    G Codes:      Anissa Abbs H. Manson PasseyBrown, PT, DPT, NCS 05/20/2015, 10:03 AM [phone]

## 2015-07-24 ENCOUNTER — Observation Stay
Admission: EM | Admit: 2015-07-24 | Discharge: 2015-07-27 | Disposition: A | Discharge status: Skilled Nursing Facility | Payer: Medicare Other | Attending: Internal Medicine | Admitting: Internal Medicine

## 2015-07-24 ENCOUNTER — Encounter: Payer: Self-pay | Admitting: *Deleted

## 2015-07-24 DIAGNOSIS — A0811 Acute gastroenteropathy due to Norwalk agent: Secondary | ICD-10-CM | POA: Diagnosis present

## 2015-07-24 DIAGNOSIS — E876 Hypokalemia: Secondary | ICD-10-CM | POA: Diagnosis not present

## 2015-07-24 DIAGNOSIS — E869 Volume depletion, unspecified: Secondary | ICD-10-CM | POA: Diagnosis present

## 2015-07-24 DIAGNOSIS — A0819 Acute gastroenteropathy due to other small round viruses: Secondary | ICD-10-CM | POA: Diagnosis not present

## 2015-07-24 DIAGNOSIS — Z452 Encounter for adjustment and management of vascular access device: Secondary | ICD-10-CM

## 2015-07-24 DIAGNOSIS — N390 Urinary tract infection, site not specified: Secondary | ICD-10-CM | POA: Diagnosis present

## 2015-07-24 DIAGNOSIS — E785 Hyperlipidemia, unspecified: Secondary | ICD-10-CM | POA: Diagnosis not present

## 2015-07-24 DIAGNOSIS — Z7982 Long term (current) use of aspirin: Secondary | ICD-10-CM | POA: Diagnosis not present

## 2015-07-24 DIAGNOSIS — I69351 Hemiplegia and hemiparesis following cerebral infarction affecting right dominant side: Secondary | ICD-10-CM | POA: Diagnosis not present

## 2015-07-24 DIAGNOSIS — Z90710 Acquired absence of both cervix and uterus: Secondary | ICD-10-CM | POA: Insufficient documentation

## 2015-07-24 DIAGNOSIS — M1711 Unilateral primary osteoarthritis, right knee: Secondary | ICD-10-CM | POA: Diagnosis not present

## 2015-07-24 DIAGNOSIS — Z823 Family history of stroke: Secondary | ICD-10-CM | POA: Insufficient documentation

## 2015-07-24 DIAGNOSIS — K219 Gastro-esophageal reflux disease without esophagitis: Secondary | ICD-10-CM | POA: Insufficient documentation

## 2015-07-24 DIAGNOSIS — R197 Diarrhea, unspecified: Secondary | ICD-10-CM | POA: Insufficient documentation

## 2015-07-24 DIAGNOSIS — I10 Essential (primary) hypertension: Secondary | ICD-10-CM | POA: Insufficient documentation

## 2015-07-24 DIAGNOSIS — Z818 Family history of other mental and behavioral disorders: Secondary | ICD-10-CM | POA: Insufficient documentation

## 2015-07-24 DIAGNOSIS — I517 Cardiomegaly: Secondary | ICD-10-CM | POA: Insufficient documentation

## 2015-07-24 DIAGNOSIS — R531 Weakness: Principal | ICD-10-CM | POA: Insufficient documentation

## 2015-07-24 DIAGNOSIS — R109 Unspecified abdominal pain: Secondary | ICD-10-CM | POA: Diagnosis not present

## 2015-07-24 DIAGNOSIS — L89159 Pressure ulcer of sacral region, unspecified stage: Secondary | ICD-10-CM | POA: Diagnosis not present

## 2015-07-24 DIAGNOSIS — K5790 Diverticulosis of intestine, part unspecified, without perforation or abscess without bleeding: Secondary | ICD-10-CM | POA: Insufficient documentation

## 2015-07-24 DIAGNOSIS — M810 Age-related osteoporosis without current pathological fracture: Secondary | ICD-10-CM | POA: Diagnosis not present

## 2015-07-24 DIAGNOSIS — Z1612 Extended spectrum beta lactamase (ESBL) resistance: Secondary | ICD-10-CM | POA: Insufficient documentation

## 2015-07-24 DIAGNOSIS — Z881 Allergy status to other antibiotic agents status: Secondary | ICD-10-CM | POA: Insufficient documentation

## 2015-07-24 DIAGNOSIS — Z9049 Acquired absence of other specified parts of digestive tract: Secondary | ICD-10-CM | POA: Insufficient documentation

## 2015-07-24 DIAGNOSIS — F419 Anxiety disorder, unspecified: Secondary | ICD-10-CM | POA: Diagnosis not present

## 2015-07-24 DIAGNOSIS — Z800 Family history of malignant neoplasm of digestive organs: Secondary | ICD-10-CM | POA: Insufficient documentation

## 2015-07-24 LAB — COMPREHENSIVE METABOLIC PANEL
ALT: 13 U/L — AB (ref 14–54)
AST: 20 U/L (ref 15–41)
Albumin: 2.2 g/dL — ABNORMAL LOW (ref 3.5–5.0)
Alkaline Phosphatase: 82 U/L (ref 38–126)
Anion gap: 5 (ref 5–15)
BUN: 19 mg/dL (ref 6–20)
CHLORIDE: 101 mmol/L (ref 101–111)
CO2: 33 mmol/L — ABNORMAL HIGH (ref 22–32)
CREATININE: 0.71 mg/dL (ref 0.44–1.00)
Calcium: 7.7 mg/dL — ABNORMAL LOW (ref 8.9–10.3)
Glucose, Bld: 106 mg/dL — ABNORMAL HIGH (ref 65–99)
POTASSIUM: 3.4 mmol/L — AB (ref 3.5–5.1)
Sodium: 139 mmol/L (ref 135–145)
TOTAL PROTEIN: 5.7 g/dL — AB (ref 6.5–8.1)
Total Bilirubin: 0.9 mg/dL (ref 0.3–1.2)

## 2015-07-24 LAB — GASTROINTESTINAL PANEL BY PCR, STOOL (REPLACES STOOL CULTURE)
ADENOVIRUS F40/41: NOT DETECTED
ASTROVIRUS: NOT DETECTED
CYCLOSPORA CAYETANENSIS: NOT DETECTED
Campylobacter species: NOT DETECTED
Cryptosporidium: NOT DETECTED
E. coli O157: NOT DETECTED
ENTAMOEBA HISTOLYTICA: NOT DETECTED
ENTEROAGGREGATIVE E COLI (EAEC): NOT DETECTED
ENTEROPATHOGENIC E COLI (EPEC): NOT DETECTED
ENTEROTOXIGENIC E COLI (ETEC): NOT DETECTED
GIARDIA LAMBLIA: NOT DETECTED
Norovirus GI/GII: DETECTED — AB
Plesimonas shigelloides: NOT DETECTED
Rotavirus A: NOT DETECTED
SAPOVIRUS (I, II, IV, AND V): NOT DETECTED
Salmonella species: NOT DETECTED
Shiga like toxin producing E coli (STEC): NOT DETECTED
Shigella/Enteroinvasive E coli (EIEC): NOT DETECTED
VIBRIO CHOLERAE: NOT DETECTED
VIBRIO SPECIES: NOT DETECTED
Yersinia enterocolitica: NOT DETECTED

## 2015-07-24 LAB — CBC WITH DIFFERENTIAL/PLATELET
BASOS ABS: 0 10*3/uL (ref 0–0.1)
BASOS PCT: 0 %
Eosinophils Absolute: 0 10*3/uL (ref 0–0.7)
Eosinophils Relative: 1 %
HCT: 39.8 % (ref 35.0–47.0)
Hemoglobin: 12.3 g/dL (ref 12.0–16.0)
LYMPHS PCT: 14 %
Lymphs Abs: 0.7 10*3/uL — ABNORMAL LOW (ref 1.0–3.6)
MCH: 23 pg — ABNORMAL LOW (ref 26.0–34.0)
MCHC: 30.9 g/dL — ABNORMAL LOW (ref 32.0–36.0)
MCV: 74.5 fL — AB (ref 80.0–100.0)
Monocytes Absolute: 0.4 10*3/uL (ref 0.2–0.9)
Monocytes Relative: 8 %
Neutro Abs: 3.8 10*3/uL (ref 1.4–6.5)
Neutrophils Relative %: 77 %
PLATELETS: 247 10*3/uL (ref 150–440)
RBC: 5.35 MIL/uL — AB (ref 3.80–5.20)
RDW: 18.8 % — AB (ref 11.5–14.5)
WBC: 4.9 10*3/uL (ref 3.6–11.0)

## 2015-07-24 LAB — URINALYSIS COMPLETE WITH MICROSCOPIC (ARMC ONLY)
BILIRUBIN URINE: NEGATIVE
Glucose, UA: NEGATIVE mg/dL
HGB URINE DIPSTICK: NEGATIVE
KETONES UR: NEGATIVE mg/dL
NITRITE: NEGATIVE
Protein, ur: 30 mg/dL — AB
SPECIFIC GRAVITY, URINE: 1.026 (ref 1.005–1.030)
pH: 5 (ref 5.0–8.0)

## 2015-07-24 LAB — C DIFFICILE QUICK SCREEN W PCR REFLEX
C Diff antigen: NEGATIVE
C Diff interpretation: NEGATIVE
C Diff toxin: NEGATIVE

## 2015-07-24 LAB — LIPASE, BLOOD: LIPASE: 23 U/L (ref 11–51)

## 2015-07-24 LAB — LACTIC ACID, PLASMA: LACTIC ACID, VENOUS: 1.3 mmol/L (ref 0.5–2.0)

## 2015-07-24 MED ORDER — DEXTROSE 5 % IV SOLN
1.0000 g | INTRAVENOUS | Status: AC
  Administered 2015-07-24: 1 g via INTRAVENOUS
  Filled 2015-07-24: qty 10

## 2015-07-24 MED ORDER — ACETAMINOPHEN 325 MG PO TABS: 650.0000 mg | ORAL_TABLET | Freq: Four times a day (QID) | ORAL | Status: DC | PRN

## 2015-07-24 MED ORDER — MORPHINE SULFATE (PF) 2 MG/ML IV SOLN: 2.0000 mg | INTRAVENOUS | Status: DC | PRN

## 2015-07-24 MED ORDER — ACETAMINOPHEN 650 MG RE SUPP: 650.0000 mg | Freq: Four times a day (QID) | RECTAL | Status: DC | PRN

## 2015-07-24 MED ORDER — HEPARIN SODIUM (PORCINE) 5000 UNIT/ML IJ SOLN
5000.0000 [IU] | Freq: Three times a day (TID) | INTRAMUSCULAR | Status: DC
  Administered 2015-07-25 (×3): 5000 [IU] via SUBCUTANEOUS
  Filled 2015-07-24 (×4): qty 1

## 2015-07-24 MED ORDER — OXYCODONE HCL 5 MG PO TABS: 5.0000 mg | ORAL_TABLET | ORAL | Status: DC | PRN

## 2015-07-24 MED ORDER — POTASSIUM CHLORIDE IN NACL 20-0.9 MEQ/L-% IV SOLN
INTRAVENOUS | Status: DC
  Administered 2015-07-25 – 2015-07-27 (×3): via INTRAVENOUS
  Filled 2015-07-24 (×4): qty 1000

## 2015-07-24 MED ORDER — SODIUM CHLORIDE 0.9 % IV BOLUS (SEPSIS)
500.0000 mL | INTRAVENOUS | Status: AC
  Administered 2015-07-24: 500 mL via INTRAVENOUS

## 2015-07-24 MED ORDER — ONDANSETRON HCL 4 MG/2ML IJ SOLN: 4.0000 mg | Freq: Four times a day (QID) | INTRAMUSCULAR | Status: DC | PRN

## 2015-07-24 MED ORDER — SODIUM CHLORIDE 0.9 % IV SOLN: Freq: Once | INTRAVENOUS | Status: DC

## 2015-07-24 MED ORDER — ONDANSETRON HCL 4 MG PO TABS: 4.0000 mg | ORAL_TABLET | Freq: Four times a day (QID) | ORAL | Status: DC | PRN

## 2015-07-24 NOTE — H&P (Signed)
[facility] - [place] at [facility]   PATIENT NAME: Anna [name]    MR#:  [id number]  DATE OF BIRTH:  1922/05/29   DATE OF ADMISSION:  07/24/2015  PRIMARY CARE PHYSICIAN: Wynona DoveWALKER,JENNIFER AZBELL, MD   REQUESTING/REFERRING PHYSICIAN: York CeriseForbach  CHIEF COMPLAINT:   Diarrhea  HISTORY OF PRESENT ILLNESS:  Anna [name]  is a 82 y.o. female with a known history of essential hypertension, GERD without esophagitis CVA with residual neurological deficit presenting with diarrhea and weakness. Patient unable to provide meaningful information given mental status/medical condition-is at baseline per daughter at bedside, history obtained from daughter. She says the patient has had approximately 3 week total duration of diarrhea multiple loose bowel movements daily nonbloody and nonmucoid she has had progressive weakness to the point where she requires 2 person assist. sHe has also developed a sacral decubitus ulcer prior to arrival.  PAST MEDICAL HISTORY:   Past Medical History  Diagnosis Date  . Hypertension   . GERD (gastroesophageal reflux disease)   . Allergy   . Hyperlipidemia   . Anxiety   . Osteoporosis   . Arthritis     DEGENERATIVE; AFFECTING R KNEE  . Palpitations   . Lower GI bleed 2005 2009    SECONDARY TO DIVERTICULOSIS  . Hyperglycemia   . Recurrent UTI   . Recurrent aspiration bronchitis/pneumonia (HCC)   . Diverticulosis   . Bladder prolapse   . CVA (cerebral infarction)     PAST SURGICAL HISTORY:   Past Surgical History  Procedure Laterality Date  . Cholecystectomy    . Abdominal hysterectomy    . Hemorroidectomy      SOCIAL HISTORY:   Social History  Substance Use Topics  . Smoking status: Never Smoker   . Smokeless tobacco: Never Used  . Alcohol Use: No    FAMILY HISTORY:   Family History  Problem Relation Age of Onset  . Mental illness Mother     DEMENTIA  . Stroke Mother     MINI STROKES  . Cancer Father     PANCREATIC   . Cancer Sister     STOMACH    DRUG ALLERGIES:   Allergies  Allergen Reactions  . Ciprofloxacin Nausea And Vomiting  . Macrobid [Nitrofurantoin Monohydrate Macrocrystals] Nausea And Vomiting    REVIEW OF SYSTEMS:  Unobtainable given patient's mental status/medical condition  MEDICATIONS AT HOME:   Prior to Admission medications   Medication Sig Start Date End Date Taking? Authorizing Provider  aspirin EC 81 MG tablet Take 81 mg by mouth daily.     Yes Historical Provider, MD  cetirizine (ZYRTEC) 10 MG tablet Take 10 mg by mouth at bedtime.   Yes Historical Provider, MD  lisinopril (PRINIVIL,ZESTRIL) 2.5 MG tablet Take 2.5 mg by mouth daily.   Yes Historical Provider, MD  metoprolol tartrate (LOPRESSOR) 25 MG tablet Take 12.5 mg by mouth 2 (two) times daily.   Yes Historical Provider, MD  montelukast (SINGULAIR) 10 MG tablet Take 10 mg by mouth daily.   Yes Historical Provider, MD  naproxen sodium (ANAPROX) 220 MG tablet Take 220 mg by mouth 2 (two) times daily as needed (for pain).   Yes Historical Provider, MD  nystatin cream (MYCOSTATIN) Apply 1 application topically 3 (three) times daily as needed (for itching/irritation).   Yes Historical Provider, MD  pantoprazole (PROTONIX) 40 MG tablet Take 1 tablet (40 mg total) by mouth daily. 01/06/13  Yes Shelia MediaJennifer A Walker, MD  simvastatin (ZOCOR) 10 MG tablet  Take 10 mg by mouth at bedtime.   Yes Historical Provider, MD  cefUROXime (CEFTIN) 250 MG tablet Take 1 tablet (250 mg total) by mouth 2 (two) times daily. Patient not taking: Reported on 07/24/2015 05/20/15   Adrian Saran, MD      VITAL SIGNS:  Blood pressure 146/67, pulse 58, resp. rate 17, weight 124 lb (56.246 kg), SpO2 97 %.  PHYSICAL EXAMINATION:  VITAL SIGNS: Filed Vitals:   07/24/15 2000 07/24/15 2228  BP: 141/61 146/67  Pulse: 54 58  Resp: 14 17   GENERAL:82 y.o.female currently in mild acute distress.  HEAD: Normocephalic, atraumatic.  EYES: Pupils equal, round,  reactive to light. Extraocular muscles intact. No scleral icterus.  MOUTH: Dry mucosal membrane. Dentition intact. No abscess noted.  EAR, NOSE, THROAT: Clear without exudates. No external lesions.  NECK: Supple. No thyromegaly. No nodules. No JVD.  PULMONARY: Clear to ascultation, without wheeze rails or rhonci. No use of accessory muscles, Good respiratory effort. good air entry bilaterally CHEST: Nontender to palpation.  CARDIOVASCULAR: S1 and S2. Regular rate and rhythm. No murmurs, rubs, or gallops. No edema. Pedal pulses 2+ bilaterally.  GASTROINTESTINAL: Soft, nontender, nondistended. No masses. Positive bowel sounds. No hepatosplenomegaly.  MUSCULOSKELETAL: No swelling, clubbing, or edema. Range of motion full in all extremities.  NEUROLOGIC: Unable to fully assess given patient's mental status/medical condition SKIN: Sacral decubitus ulcer present No further ulceration, lesions, rashes, or cyanosis. Skin warm and dry. Turgor intact.  PSYCHIATRIC: Unable to fully assess given patient's mental status/medical condition   LABORATORY PANEL:   CBC  Recent Labs Lab 07/24/15 1332  WBC 4.9  HGB 12.3  HCT 39.8  PLT 247   ------------------------------------------------------------------------------------------------------------------  Chemistries   Recent Labs Lab 07/24/15 1332  NA 139  K 3.4*  CL 101  CO2 33*  GLUCOSE 106*  BUN 19  CREATININE 0.71  CALCIUM 7.7*  AST 20  ALT 13*  ALKPHOS 82  BILITOT 0.9   ------------------------------------------------------------------------------------------------------------------  Cardiac Enzymes No results for input(s): TROPONINI in the last 168 hours. ------------------------------------------------------------------------------------------------------------------  RADIOLOGY:  No results found.  EKG:   Orders placed or performed during the hospital encounter of 07/24/15  . ED EKG  . ED EKG    IMPRESSION AND PLAN:    82 year old Caucasian female history of essential hypertension who is coming in with diarrhea  1. Gastroenteritis secondary to norovirus: Supportive care 2. Urinary tract infection site unspecified: Ceftriaxone and follow urine culture data and 3. Hypokalemia: Replace potassium, check magnesium levels 4. Sacral decubitus ulcer present on admission: Wound consult 5. Essential hypertension lisinopril, Lopressor 6. Venous embolism prophylactic: Heparin subcutaneous    All the records are reviewed and case discussed with ED provider. Management plans discussed with the patient, family and they are in agreement.  CODE STATUS: Full  TOTAL TIME TAKING CARE OF THIS PATIENT: 35 minutes.    Hower,  Mardi Mainland.D on 07/24/2015 at 11:38 PM  Between 7am to 6pm - Pager - [phone]  After 6pm: House Pager: [phone]  [facility]  Office  [phone]  CC: Primary care physician; Wynona Dove, MD

## 2015-07-24 NOTE — ED Notes (Signed)
Allevyn applied to sacrum. No stool noted at this time.

## 2015-07-24 NOTE — ED Notes (Signed)
Per EMS report, patient has had diverticulitis on and off since coming home from rehab after Thanksgiving. Per EMS report, patient has had loose stools for two weeks. Family has been unable to collect stool specimen from patient's diapers. Patient has garbled speech from previous stroke. Patient indicated to EMS that she has lower abdominal pain.

## 2015-07-24 NOTE — ED Provider Notes (Signed)
[facility] Emergency Department Provider Note  ____________________________________________  Time seen: Approximately 8:29 PM  I have reviewed the triage vital signs and the nursing notes.   HISTORY  Chief Complaint Abdominal Pain  The patient has chronic dysarthria due to a prior stroke.  She is at her baseline according to her daughters.  HPI Anna [name] is a 82 y.o. female with an extensive past medical history presents by EMS for more than 2 weeks of loose stools.  The patient's daughters report that she has had these loose stools possibly as long as since the beginning of December (more than a month ago).  However, it has gotten much worse over the last week, as many as 8-10 episodes per day, and she has had significantly decreased by mouth intake due to general discomfort and nausea.  She has seen her primary care doctor who obtained a CT scan which was unremarkable.  She has developed a sacral wound and she has been referred to [facility] but has not yet seen them.  She has not had any fever or chills, chest pain, shortness of breath  Past Medical History  Diagnosis Date  . Hypertension   . GERD (gastroesophageal reflux disease)   . Allergy   . Hyperlipidemia   . Anxiety   . Osteoporosis   . Arthritis     DEGENERATIVE; AFFECTING R KNEE  . Palpitations   . Lower GI bleed 2005 2009    SECONDARY TO DIVERTICULOSIS  . Hyperglycemia   . Recurrent UTI   . Recurrent aspiration bronchitis/pneumonia (HCC)   . Diverticulosis   . Bladder prolapse   . CVA (cerebral infarction)     Patient Active Problem List   Diagnosis Date Noted  . Diverticulitis 05/15/2015  . Yeast infection 01/09/2013  . Dysuria 01/09/2013  . Bereavement 12/14/2012  . Impaired gait 12/14/2012  . Vitamin D deficiency 03/09/2012  . GERD (gastroesophageal reflux disease) 12/29/2011  . Iron deficiency anemia 12/29/2011  . Elevated blood sugar 12/29/2011  . Memory loss 12/29/2011   . Rectal/anal hemorrhage 08/26/2011  . Anxiety 07/09/2011  . Hyperlipidemia 07/01/2011  . Hypertension 07/01/2011    Past Surgical History  Procedure Laterality Date  . Cholecystectomy    . Abdominal hysterectomy    . Hemorroidectomy      Current Outpatient Rx  Name  Route  Sig  Dispense  Refill  . aspirin EC 81 MG tablet   Oral   Take 81 mg by mouth daily.           . cetirizine (ZYRTEC) 10 MG tablet   Oral   Take 10 mg by mouth at bedtime.         Marland Kitchen lisinopril (PRINIVIL,ZESTRIL) 2.5 MG tablet   Oral   Take 2.5 mg by mouth daily.         . metoprolol tartrate (LOPRESSOR) 25 MG tablet   Oral   Take 12.5 mg by mouth 2 (two) times daily.         . montelukast (SINGULAIR) 10 MG tablet   Oral   Take 10 mg by mouth daily.         . naproxen sodium (ANAPROX) 220 MG tablet   Oral   Take 220 mg by mouth 2 (two) times daily as needed (for pain).         . nystatin cream (MYCOSTATIN)   Topical   Apply 1 application topically 3 (three) times daily as needed (for itching/irritation).         Marland Kitchen  pantoprazole (PROTONIX) 40 MG tablet   Oral   Take 1 tablet (40 mg total) by mouth daily.   30 tablet   6   . simvastatin (ZOCOR) 10 MG tablet   Oral   Take 10 mg by mouth at bedtime.         . cefUROXime (CEFTIN) 250 MG tablet   Oral   Take 1 tablet (250 mg total) by mouth 2 (two) times daily. Patient not taking: Reported on 07/24/2015   8 tablet   0     Allergies Ciprofloxacin and Macrobid  Family History  Problem Relation Age of Onset  . Mental illness Mother     DEMENTIA  . Stroke Mother     MINI STROKES  . Cancer Father     PANCREATIC  . Cancer Sister     STOMACH    Social History Social History  Substance Use Topics  . Smoking status: Never Smoker   . Smokeless tobacco: Never Used  . Alcohol Use: No    Review of Systems Constitutional: No fever/chills Eyes: No visual changes. ENT: No sore throat. Cardiovascular: Denies chest  pain. Respiratory: Denies shortness of breath. Gastrointestinal: No abdominal pain.  No nausea, no vomiting.  Frequent chronic diarrhea. Genitourinary: Negative for dysuria. Musculoskeletal: Negative for back pain. Skin: Negative for rash.  Sacral pressure wound. Neurological: Negative for headaches, focal weakness or numbness.  10-point ROS otherwise negative.  ____________________________________________   PHYSICAL EXAM:  VITAL SIGNS: ED Triage Vitals  Enc Vitals Group     BP 07/24/15 1328 123/70 mmHg     Pulse Rate 07/24/15 1328 65     Resp 07/24/15 1328 18     Temp 07/24/15 1328 97.6 F (36.4 C)     Temp Source 07/24/15 1328 Oral     SpO2 07/24/15 1328 98 %     Weight 07/24/15 1453 124 lb (56.246 kg)     Height --      Head Cir --      Peak Flow --      Pain Score --      Pain Loc --      Pain Edu? --      Excl. in GC? --     Constitutional: Alert and oriented, communicative in spite of her chronic verbal impairment, appears pale and chronically ill. Eyes: Conjunctivae are normal. PERRL. EOMI. Head: Atraumatic. Nose: No congestion/rhinnorhea. Mouth/Throat: Mucous membranes are dry.  Oropharynx non-erythematous. Neck: No stridor.   Cardiovascular: Normal rate, regular rhythm. Grossly normal heart sounds.  Good peripheral circulation. Respiratory: Normal respiratory effort.  No retractions. Lungs CTAB. Gastrointestinal: Soft and nontender. No distention.  Musculoskeletal: No lower extremity tenderness nor edema.  No joint effusions. Neurologic:  Normal speech and language. No gross focal neurologic deficits are appreciated.  Skin:  Skin is warm, dry and intact. No rash noted.  Stage II sacral decubitus ulcer.   ____________________________________________   LABS (all labs ordered are listed, but only abnormal results are displayed)  Labs Reviewed  GASTROINTESTINAL PANEL BY PCR, STOOL (REPLACES STOOL CULTURE) - Abnormal; Notable for the following:    Norovirus  GI/GII DETECTED (*)    All other components within normal limits  COMPREHENSIVE METABOLIC PANEL - Abnormal; Notable for the following:    Potassium 3.4 (*)    CO2 33 (*)    Glucose, Bld 106 (*)    Calcium 7.7 (*)    Total Protein 5.7 (*)    Albumin 2.2 (*)  ALT 13 (*)    All other components within normal limits  CBC WITH DIFFERENTIAL/PLATELET - Abnormal; Notable for the following:    RBC 5.35 (*)    MCV 74.5 (*)    MCH 23.0 (*)    MCHC 30.9 (*)    RDW 18.8 (*)    Lymphs Abs 0.7 (*)    All other components within normal limits  URINALYSIS COMPLETEWITH MICROSCOPIC ([facility] ONLY) - Abnormal; Notable for the following:    Color, Urine AMBER (*)    APPearance HAZY (*)    Protein, ur 30 (*)    Leukocytes, UA TRACE (*)    Bacteria, UA MANY (*)    Squamous Epithelial / LPF 0-5 (*)    All other components within normal limits  C DIFFICILE QUICK SCREEN W PCR REFLEX  URINE CULTURE  LACTIC ACID, PLASMA  LIPASE, BLOOD   ____________________________________________  EKG  ED ECG REPORT I, Aidynn Krenn, the attending physician, personally viewed and interpreted this ECG.  Date: 07/24/2015 EKG Time: 22:08 Rate: 62 Rhythm: normal sinus rhythm QRS Axis: LAD Intervals: Right bundle branch block and left anterior fascicular block.  LVH ST/T Wave abnormalities: normal Conduction Disutrbances: none Narrative Interpretation: unremarkable with no evidence of acute ischemia  ____________________________________________  RADIOLOGY   No results found.  ____________________________________________   PROCEDURES  Procedure(s) performed: None  Critical Care performed: No ____________________________________________   INITIAL IMPRESSION / ASSESSMENT AND PLAN / ED COURSE  Pertinent labs & imaging results that were available during my care of the patient were reviewed by me and considered in my medical decision making (see chart for details).  The patient appears volume  depleted, weak, and ill although nontoxic.  Her episodes of diarrhea gotten much worse over the last week although it does appear somewhat chronic over the last couple months.  It is over the last week she has become so debilitated she can no longer get herself out of bed.  She has also developed a sacral decubitus ulcer in that same period of time.  I checked for C. difficile which was negative but her GI stool panel revealed normal virus which makes sense for why she is volume depleted and having so much diarrhea.  I will bring her into the hospital for IV fluids so that she does not become more dehydrated and worsen her chronic renal disease.  She will likely need rehabilitation after her stay in the hospital due to her deconditioning.  I discussed this with the family and they understand and agree.  The patient also has a very mild urinary tract infection but given the possibility could be contaminated from her frequent stooling and her incontinence, I sent a urine culture and am treating her empirically with ceftriaxone.  ____________________________________________  FINAL CLINICAL IMPRESSION(S) / ED DIAGNOSES  Final diagnoses:  Enteritis due to Norovirus  Volume depletion, gastrointestinal loss  Generalized weakness  UTI (lower urinary tract infection)      NEW MEDICATIONS STARTED DURING THIS VISIT:  New Prescriptions   No medications on file     Loleta Roseory Amillion [place], MD 07/24/15 2215

## 2015-07-25 LAB — BASIC METABOLIC PANEL
ANION GAP: 9 (ref 5–15)
BUN: 15 mg/dL (ref 6–20)
CALCIUM: 7.7 mg/dL — AB (ref 8.9–10.3)
CO2: 24 mmol/L (ref 22–32)
Chloride: 106 mmol/L (ref 101–111)
Creatinine, Ser: 0.61 mg/dL (ref 0.44–1.00)
Glucose, Bld: 95 mg/dL (ref 65–99)
POTASSIUM: 4.3 mmol/L (ref 3.5–5.1)
SODIUM: 139 mmol/L (ref 135–145)

## 2015-07-25 LAB — CBC
HEMATOCRIT: 38.3 % (ref 35.0–47.0)
HEMOGLOBIN: 12.1 g/dL (ref 12.0–16.0)
MCH: 22.5 pg — ABNORMAL LOW (ref 26.0–34.0)
MCHC: 31.5 g/dL — ABNORMAL LOW (ref 32.0–36.0)
MCV: 71.6 fL — ABNORMAL LOW (ref 80.0–100.0)
Platelets: 220 10*3/uL (ref 150–440)
RBC: 5.35 MIL/uL — ABNORMAL HIGH (ref 3.80–5.20)
RDW: 18.9 % — ABNORMAL HIGH (ref 11.5–14.5)
WBC: 5.4 10*3/uL (ref 3.6–11.0)

## 2015-07-25 LAB — MAGNESIUM: Magnesium: 1.6 mg/dL — ABNORMAL LOW (ref 1.7–2.4)

## 2015-07-25 MED ORDER — SIMVASTATIN 20 MG PO TABS
10.0000 mg | ORAL_TABLET | Freq: Every day | ORAL | Status: DC
  Administered 2015-07-25 – 2015-07-26 (×2): 10 mg via ORAL
  Filled 2015-07-25 (×2): qty 1

## 2015-07-25 MED ORDER — LORATADINE 10 MG PO TABS
10.0000 mg | ORAL_TABLET | Freq: Every day | ORAL | Status: DC
  Administered 2015-07-25 – 2015-07-27 (×3): 10 mg via ORAL
  Filled 2015-07-25 (×3): qty 1

## 2015-07-25 MED ORDER — MONTELUKAST SODIUM 10 MG PO TABS
10.0000 mg | ORAL_TABLET | Freq: Every day | ORAL | Status: DC
  Administered 2015-07-25 – 2015-07-27 (×3): 10 mg via ORAL
  Filled 2015-07-25 (×3): qty 1

## 2015-07-25 MED ORDER — METOPROLOL TARTRATE 25 MG PO TABS
12.5000 mg | ORAL_TABLET | Freq: Two times a day (BID) | ORAL | Status: DC
  Administered 2015-07-25 – 2015-07-27 (×5): 12.5 mg via ORAL
  Filled 2015-07-25 (×5): qty 1

## 2015-07-25 MED ORDER — PANTOPRAZOLE SODIUM 40 MG PO TBEC
40.0000 mg | DELAYED_RELEASE_TABLET | Freq: Every day | ORAL | Status: DC
  Administered 2015-07-25 – 2015-07-27 (×3): 40 mg via ORAL
  Filled 2015-07-25 (×3): qty 1

## 2015-07-25 MED ORDER — DEXTROSE 5 % IV SOLN
2.0000 g | INTRAVENOUS | Status: DC
  Administered 2015-07-25: 2 g via INTRAVENOUS
  Filled 2015-07-25 (×2): qty 2

## 2015-07-25 MED ORDER — NAPROXEN 250 MG PO TABS
250.0000 mg | ORAL_TABLET | Freq: Two times a day (BID) | ORAL | Status: DC | PRN
  Filled 2015-07-25: qty 1

## 2015-07-25 MED ORDER — LISINOPRIL 5 MG PO TABS
2.5000 mg | ORAL_TABLET | Freq: Every day | ORAL | Status: DC
  Administered 2015-07-25 – 2015-07-27 (×3): 2.5 mg via ORAL
  Filled 2015-07-25 (×3): qty 1

## 2015-07-25 MED ORDER — MAGNESIUM SULFATE 2 GM/50ML IV SOLN
2.0000 g | Freq: Once | INTRAVENOUS | Status: AC
  Administered 2015-07-25: 2 g via INTRAVENOUS
  Filled 2015-07-25: qty 50

## 2015-07-25 MED ORDER — ASPIRIN EC 81 MG PO TBEC
81.0000 mg | DELAYED_RELEASE_TABLET | Freq: Every day | ORAL | Status: DC
  Administered 2015-07-25 – 2015-07-27 (×3): 81 mg via ORAL
  Filled 2015-07-25 (×3): qty 1

## 2015-07-25 NOTE — Progress Notes (Signed)
PT Cancellation Note  Patient Details Name: Corky MullDoris B Johannsen MRN: [id number] DOB: Aug 17, 1921   Cancelled Treatment:    Reason Eval/Treat Not Completed: Patient declined, no reason specified  Went into room for PT eval at 8:50AM, patient eating breakfast and daughter requested that we come back later; Came back at 9:40 for PT eval and patient in bed resting on side. She refused therapy as she didn't want to get up because her bottom was hurting (secondary to excessive diarrhea) RN informed; PT will re-attempt PT eval when patient is agreeable.   Trotter,Margaret PT, DPT 07/25/2015, 10:03 AM

## 2015-07-25 NOTE — Consult Note (Addendum)
WOC wound consult note Reason for Consult:Moisture (Incontinence ) Associated Skin Damage IAD from loose stools Wound type:IAD in the presence of diarrhea Pressure Ulcer POA: Yes Measurement: 3.2 cm x 0.5 cm x 0.1 cm in the apex of the gluteal cleft Wound bed:100% pink and moist Drainage (amount, consistency, odor) Scant serous  No odor Periwound:Intact Education provided to caregivers (daughters) regarding moisture associated breakdown and discouraged the use of disposable products.  Also encouraged microshifts in position when up to chair and demonstrated how to do that.   Dressing procedure/placement/frequency:Cleanse sacral area with soap and water and pat dry.  Apply barrier cream twice daily and after incontinence.  No disposable briefs or underpads.  Will not follow at this time.  Please re-consult if needed.  Maple HudsonKaren Yutaka Holberg RN BSN CWON Pager [phone]

## 2015-07-25 NOTE — Progress Notes (Signed)
[facility] [place] [facility]   PATIENT NAME: Anna [name]    MR#:  [id number]  DATE OF BIRTH:  1922-01-22  SUBJECTIVE:  Patient here with diarrhea. Daughter is at bedside.  REVIEW OF SYSTEMS:    Review of Systems  Constitutional: Negative for fever, chills and malaise/fatigue.  HENT: Negative for sore throat.   Eyes: Negative for blurred vision.  Respiratory: Negative for cough, hemoptysis, shortness of breath and wheezing.   Cardiovascular: Negative for chest pain, palpitations and leg swelling.  Gastrointestinal: Positive for diarrhea. Negative for nausea, vomiting, abdominal pain and blood in stool.  Genitourinary: Negative for dysuria.  Musculoskeletal: Negative for back pain.  Neurological: Negative for dizziness, tremors and headaches.  Endo/Heme/Allergies: Does not bruise/bleed easily.    Tolerating Diet: Yes      DRUG ALLERGIES:   Allergies  Allergen Reactions  . Ciprofloxacin Nausea And Vomiting  . Macrobid [Nitrofurantoin Monohydrate Macrocrystals] Nausea And Vomiting    VITALS:  Blood pressure 125/84, pulse 82, temperature 96.8 F (36 C), temperature source Axillary, resp. rate 18, height 5' (1.524 m), weight 61.735 kg (136 lb 1.6 oz), SpO2 94 %.  PHYSICAL EXAMINATION:   Physical Exam  Constitutional: She is well-developed, well-nourished, and in no distress. No distress.  HENT:  Head: Normocephalic.  Eyes: No scleral icterus.  Neck: Normal range of motion. Neck supple. No JVD present. No tracheal deviation present.  Cardiovascular: Normal rate, regular rhythm and normal heart sounds.  Exam reveals no gallop and no friction rub.   No murmur heard. Pulmonary/Chest: Effort normal and breath sounds normal. No respiratory distress. She has no wheezes. She has no rales. She exhibits no tenderness.  Abdominal: Soft. Bowel sounds are normal. She exhibits no distension and no mass. There is no tenderness. There is no rebound and  no guarding.  Musculoskeletal: Normal range of motion. She exhibits no edema.  Neurological: She is alert.  She has baseline right facial droop from old stroke and right-sided weakness  Skin: Skin is warm. No rash noted. No erythema.  Psychiatric: Affect and judgment normal.      LABORATORY PANEL:   CBC  Recent Labs Lab 07/25/15 0908  WBC 5.4  HGB 12.1  HCT 38.3  PLT 220   ------------------------------------------------------------------------------------------------------------------  Chemistries   Recent Labs Lab 07/24/15 1332 07/25/15 0908  NA 139 139  K 3.4* 4.3  CL 101 106  CO2 33* 24  GLUCOSE 106* 95  BUN 19 15  CREATININE 0.71 0.61  CALCIUM 7.7* 7.7*  MG  --  1.6*  AST 20  --   ALT 13*  --   ALKPHOS 82  --   BILITOT 0.9  --    ------------------------------------------------------------------------------------------------------------------  Cardiac Enzymes No results for input(s): TROPONINI in the last 168 hours. ------------------------------------------------------------------------------------------------------------------  RADIOLOGY:  No results found.   ASSESSMENT AND PLAN:   82 year old female with a history of CVA with residual right-sided weakness who presents with diarrhea.  1. Norvasc diarrhea: Patient's C. difficile was negative however positive for normal vital wrist. Treatment is supportive. Continue with IV fluids and antiemetics if needed and anti-diarrheal medications.   2. Urinary tract infection with acute cystitis: Continue Rocephin and follow up on urine culture.  3. Electrolyte abnormalities: Magnesium level is low which will be repleted. Pharmacy will assist with Electra's.  4. Sacral decubitus present on admission: 1 consultation has been placed.  5 Essential hypertension: Continue lisinopril and Lopressor.  6. History of stroke with residual deficits:  Continue aspirin and simvastatin.   Management plans discussed  with the patient and she is in agreement.  CODE STATUS: Full  TOTAL TIME TAKING CARE OF THIS PATIENT: 30 minutes.   Then discussed with daughter at bedside. PT consultation for evaluation for disposition.  POSSIBLE D/C tomorrow, DEPENDING ON CLINICAL CONDITION.   Anna [name] M.D on 07/25/2015 at 11:52 AM  Between 7am to 6pm - Pager - [phone] After 6pm go to [email or web address] - password [email or web address]  Anna [name]  Office  [phone]  CC: Primary care physician; Wynona DoveWALKER,JENNIFER AZBELL, MD  Note: This dictation was prepared with Dragon dictation along with smaller phrase technology. Any transcriptional errors that result from this process are unintentional.

## 2015-07-25 NOTE — Progress Notes (Signed)
Initial Nutrition Assessment   INTERVENTION:   Meals and Snacks: Cater to patient preferences; will send yogurt BID Medical Food Supplement Therapy: will send magic Cup daily as pt does not like Ensure/Boost supplement drinks but does like ice cream   NUTRITION DIAGNOSIS:   Inadequate oral intake related to acute illness as evidenced by per patient/family report.  GOAL:   Patient will meet greater than or equal to 90% of their needs  MONITOR:    (Energy Intake, Electrolyte and renal Profile, Anthropometrics, Digestive system)  REASON FOR ASSESSMENT:    (Pressure ulcer)    ASSESSMENT:   Pt admitted with Norvasc diarrhea and UTI, currently on isolation. Per Nsg pt up most of night with agitation, pt remained asleep during visit.  Past Medical History  Diagnosis Date  . Hypertension   . GERD (gastroesophageal reflux disease)   . Allergy   . Hyperlipidemia   . Anxiety   . Osteoporosis   . Arthritis     DEGENERATIVE; AFFECTING R KNEE  . Palpitations   . Lower GI bleed 2005 2009    SECONDARY TO DIVERTICULOSIS  . Hyperglycemia   . Recurrent UTI   . Recurrent aspiration bronchitis/pneumonia (HCC)   . Diverticulosis   . Bladder prolapse   . CVA (cerebral infarction)     Diet Order:  DIET SOFT Room service appropriate?: Yes; Fluid consistency:: Thin    Current Nutrition: Pt's family member reports pt ate only bites of lunch today but did eat yogurt with medications earlier and ate some of biscuit brought in by family this am.   Food/Nutrition-Related History: Pt family member reports pt has been eating less recently since the first of the new year than usual. The family member reports opt usually has a good appetite but right after Christmas her appetite has been less, and orange for lunch or bites of salmon for dinner. Pt usually will eat egg and cheese biscuit from [employer] every morning however. Pt does not like Ensure/Boost per report.   Scheduled Medications:   . sodium chloride   Intravenous Once  . aspirin EC  81 mg Oral Daily  . cefTRIAXone (ROCEPHIN)  IV  2 g Intravenous Q24H  . heparin  5,000 Units Subcutaneous 3 times per day  . lisinopril  2.5 mg Oral Daily  . loratadine  10 mg Oral Daily  . metoprolol tartrate  12.5 mg Oral BID  . montelukast  10 mg Oral Daily  . pantoprazole  40 mg Oral Daily  . simvastatin  10 mg Oral QHS    Continuous Medications:  . 0.9 % NaCl with KCl 20 mEq / L 50 mL/hr at 07/25/15 0819     Electrolyte/Renal Profile and Glucose Profile:   Recent Labs Lab 07/24/15 1332 07/25/15 0908  NA 139 139  K 3.4* 4.3  CL 101 106  CO2 33* 24  BUN 19 15  CREATININE 0.71 0.61  CALCIUM 7.7* 7.7*  MG  --  1.6*  GLUCOSE 106* 95   Protein Profile:  Recent Labs Lab 07/24/15 1332  ALBUMIN 2.2*    Gastrointestinal Profile: Last BM: 07/25/2015, loose stools   Nutrition-Focused Physical Exam Findings:  Unable to complete Nutrition-Focused physical exam at this time.    Weight Change: Pt family member reports she has lost a lot of weight since November 2016. Per [facility] encounter pt with weight gain since November. Otherwise weight trend from 3-4 years ago.    Skin:   (Stage II pressure ulcer, sacral )  Height:   Ht Readings from Last 1 Encounters:  07/25/15 5' (1.524 m)    Weight:   Wt Readings from Last 1 Encounters:  07/25/15 136 lb 1.6 oz (61.735 kg)    Wt Readings from Last 10 Encounters:  07/25/15 136 lb 1.6 oz (61.735 kg)  05/15/15 132 lb 3.2 oz (59.966 kg)  01/09/13 147 lb (66.679 kg)  12/14/12 145 lb (65.772 kg)  08/23/12 144 lb 12 oz (65.658 kg)  04/13/12 149 lb 8 oz (67.813 kg)  03/09/12 147 lb 8 oz (66.906 kg)     BMI:  Body mass index is 26.58 kg/(m^2).  Estimated Nutritional Needs:   Kcal:  BEE: 953kcals, TEE: (IF 1.2-1.4)(AF 1.2) 1373-1601kcals  Protein:  74-93g protein (1.2-1.5g/kg)  Fluid:  1543-1853mL of fluid (25-4mL/kg)  EDUCATION NEEDS:   Education needs no  appropriate at this time    MODERATE Care Level  Leda Quail, RD, LDN Pager [phone] Weekend/On-Call Pager [phone]

## 2015-07-25 NOTE — Care Management Obs Status (Signed)
MEDICARE OBSERVATION STATUS NOTIFICATION   Patient Details  Name: Anna MullDoris B Waxman MRN: [id number] Date of Birth: 08-02-1921   Medicare Observation Status Notification Given:  Yes    Collie Siadngela Ginia Rudell, RN 07/25/2015, 12:00 PM

## 2015-07-25 NOTE — Progress Notes (Signed)
Turned and psoitioned q2hr. Incontinent of bowel and bladder. Stool mucoid. Agitated and combative at times. Family at bedside.no diapers in use per WOC RN INSTRUCTIONS

## 2015-07-25 NOTE — Progress Notes (Signed)
Family refuses heparin injections . rn spoke with dr Luberta Mutterkonidena  Notified . Heparin d/ced

## 2015-07-25 NOTE — Care Management (Addendum)
RNCM consult received and will continue to follow. Patient refused to work with PT this morning. [facility]. I spoke with his daughter Jan and she wants patient to go to SNF. She lives with her sister Darel Hong(Judy) and Jan lives across the road. They have a hired caregiver 6 hours/day. Jan relates bedsore to incontinent stools although patient is not usually incontinent. Per Jan [facility] patient received home health through [facility]. Patient mostly sits at home. She has a help/lift chair, walker (she don't use), transport chair, hospital bed, bedside commode. RNCM will continue to follow. CSW updated. She will need EMS per Jan if she returns home.

## 2015-07-25 NOTE — Progress Notes (Signed)
Pharmacy Consult for Electrolyte Monitoring   Allergies  Allergen Reactions  . Ciprofloxacin Nausea And Vomiting  . Macrobid [Nitrofurantoin Monohydrate Macrocrystals] Nausea And Vomiting    Patient Measurements: Height: 5' (152.4 cm) Weight: 136 lb 1.6 oz (61.735 kg) IBW/kg (Calculated) : 45.5  Vital Signs: Temp: 96.8 F (36 C) (01/12 0840) Temp Source: Axillary (01/12 0840) BP: 125/84 mmHg (01/12 1114) Pulse Rate: 82 (01/12 1114) Intake/Output from previous day:   Intake/Output from this shift:    Labs:  Recent Labs  07/24/15 1332 07/25/15 0908  WBC 4.9 5.4  HGB 12.3 12.1  HCT 39.8 38.3  PLT 247 220     Recent Labs  07/24/15 1332 07/25/15 0908  NA 139 139  K 3.4* 4.3  CL 101 106  CO2 33* 24  GLUCOSE 106* 95  BUN 19 15  CREATININE 0.71 0.61  CALCIUM 7.7* 7.7*  MG  --  1.6*  PROT 5.7*  --   ALBUMIN 2.2*  --   AST 20  --   ALT 13*  --   ALKPHOS 82  --   BILITOT 0.9  --    Estimated Creatinine Clearance: 37.6 mL/min (by C-G formula based on Cr of 0.61).   No results for input(s): GLUCAP in the last 72 hours.  Medical History: Past Medical History  Diagnosis Date  . Hypertension   . GERD (gastroesophageal reflux disease)   . Allergy   . Hyperlipidemia   . Anxiety   . Osteoporosis   . Arthritis     DEGENERATIVE; AFFECTING R KNEE  . Palpitations   . Lower GI bleed 2005 2009    SECONDARY TO DIVERTICULOSIS  . Hyperglycemia   . Recurrent UTI   . Recurrent aspiration bronchitis/pneumonia (HCC)   . Diverticulosis   . Bladder prolapse   . CVA (cerebral infarction)     Medications:  Scheduled:  . sodium chloride   Intravenous Once  . aspirin EC  81 mg Oral Daily  . cefTRIAXone (ROCEPHIN)  IV  2 g Intravenous Q24H  . heparin  5,000 Units Subcutaneous 3 times per day  . lisinopril  2.5 mg Oral Daily  . loratadine  10 mg Oral Daily  . magnesium sulfate 1 - 4 g bolus IVPB  2 g Intravenous Once  . metoprolol tartrate  12.5 mg Oral BID  .  montelukast  10 mg Oral Daily  . pantoprazole  40 mg Oral Daily  . simvastatin  10 mg Oral QHS   Infusions:  . 0.9 % NaCl with KCl 20 mEq / L 50 mL/hr at 07/25/15 [id number]    Assessment: Pharmacy consulted to assist in managing electrolytes in this 82 y/o F with gastroenteritis and UTI.   Plan:  Magnesium is low so will replace with magnesium sulfate 2 g iv once and f/u am labs.   Luisa HartChristy, Obert Espindola D 07/25/2015,12:23 PM

## 2015-07-25 NOTE — Clinical Social Work Note (Signed)
Clinical Social Worker consulted for [facility]. PT consult is pending. PT eval is needed for prior auth for SNF placement. Full assessment to follow, if appropriate. CSW will continue to follow.   Dede QuerySarah Axelle Szwed, MSW, LCSW  Clinical Social Worker [phone]

## 2015-07-25 NOTE — Progress Notes (Signed)
ANTIBIOTIC CONSULT NOTE - INITIAL  Pharmacy Consult for ceftriaxone dosing Indication: UTI  Allergies  Allergen Reactions  . Ciprofloxacin Nausea And Vomiting  . Macrobid [Nitrofurantoin Monohydrate Macrocrystals] Nausea And Vomiting    Patient Measurements: Height: 5' (152.4 cm) Weight: 136 lb 1.6 oz (61.735 kg) IBW/kg (Calculated) : 45.5 Adjusted Body Weight: n/a  Vital Signs: Temp: 98 F (36.7 C) (01/12 0047) BP: 128/47 mmHg (01/12 0047) Pulse Rate: 61 (01/12 0047) Intake/Output from previous day:   Intake/Output from this shift:    Labs:  Recent Labs  07/24/15 1332  WBC 4.9  HGB 12.3  PLT 247  CREATININE 0.71   Estimated Creatinine Clearance: 37.6 mL/min (by C-G formula based on Cr of 0.71). No results for input(s): VANCOTROUGH, VANCOPEAK, VANCORANDOM, GENTTROUGH, GENTPEAK, GENTRANDOM, TOBRATROUGH, TOBRAPEAK, TOBRARND, AMIKACINPEAK, AMIKACINTROU, AMIKACIN in the last 72 hours.   Microbiology: Recent Results (from the past 720 hour(s))  Gastrointestinal Panel by PCR , Stool     Status: Abnormal   Collection Time: 07/24/15  6:38 PM  Result Value Ref Range Status   Campylobacter species NOT DETECTED NOT DETECTED Final   Plesimonas shigelloides NOT DETECTED NOT DETECTED Final   Salmonella species NOT DETECTED NOT DETECTED Final   Yersinia enterocolitica NOT DETECTED NOT DETECTED Final   Vibrio species NOT DETECTED NOT DETECTED Final   Vibrio cholerae NOT DETECTED NOT DETECTED Final   Enteroaggregative E coli (EAEC) NOT DETECTED NOT DETECTED Final   Enteropathogenic E coli (EPEC) NOT DETECTED NOT DETECTED Final   Enterotoxigenic E coli (ETEC) NOT DETECTED NOT DETECTED Final   Shiga like toxin producing E coli (STEC) NOT DETECTED NOT DETECTED Final   E. coli O157 NOT DETECTED NOT DETECTED Final   Shigella/Enteroinvasive E coli (EIEC) NOT DETECTED NOT DETECTED Final   Cryptosporidium NOT DETECTED NOT DETECTED Final   Cyclospora cayetanensis NOT DETECTED NOT  DETECTED Final   Entamoeba histolytica NOT DETECTED NOT DETECTED Final   Giardia lamblia NOT DETECTED NOT DETECTED Final   Adenovirus F40/41 NOT DETECTED NOT DETECTED Final   Astrovirus NOT DETECTED NOT DETECTED Final   Norovirus GI/GII DETECTED (A) NOT DETECTED Final    Comment: CALLED ELIZABETH WALL AT 2102 07/24/15 MLZ    Rotavirus A NOT DETECTED NOT DETECTED Final   Sapovirus (I, II, IV, and V) NOT DETECTED NOT DETECTED Final  C difficile quick scan w PCR reflex     Status: None   Collection Time: 07/24/15  6:38 PM  Result Value Ref Range Status   C Diff antigen NEGATIVE NEGATIVE Final   C Diff toxin NEGATIVE NEGATIVE Final   C Diff interpretation Negative for C. difficile  Final    Medical History: Past Medical History  Diagnosis Date  . Hypertension   . GERD (gastroesophageal reflux disease)   . Allergy   . Hyperlipidemia   . Anxiety   . Osteoporosis   . Arthritis     DEGENERATIVE; AFFECTING R KNEE  . Palpitations   . Lower GI bleed 2005 2009    SECONDARY TO DIVERTICULOSIS  . Hyperglycemia   . Recurrent UTI   . Recurrent aspiration bronchitis/pneumonia (HCC)   . Diverticulosis   . Bladder prolapse   . CVA (cerebral infarction)     Medications:   Assessment: Urine cx pending UA: LR(tr) NO2(-) WBC 6-30  Goal of Therapy:  Resolution of infection  Plan:  Ceftriaxone 2 grams q 24 hours ordered.  Carold Eisner S 07/25/2015,3:30 AM

## 2015-07-25 NOTE — Progress Notes (Signed)
Patient IV infiltrated and was leaking. She refused and became very agitated while trying to start a new IV. Patient allowed to calm down. Nursing supervisor called to assist with insertion. Patient again became agitated and second attempt was unsuccessful as well.

## 2015-07-26 ENCOUNTER — Observation Stay: Payer: Medicare Other

## 2015-07-26 ENCOUNTER — Encounter
Admission: RE | Admit: 2015-07-26 | Discharge: 2015-07-26 | Disposition: A | Discharge status: Home / Self Care | Payer: Medicare Other | Source: Ambulatory Visit | Attending: Internal Medicine | Admitting: Internal Medicine

## 2015-07-26 DIAGNOSIS — R410 Disorientation, unspecified: Secondary | ICD-10-CM | POA: Insufficient documentation

## 2015-07-26 DIAGNOSIS — E876 Hypokalemia: Secondary | ICD-10-CM | POA: Insufficient documentation

## 2015-07-26 DIAGNOSIS — D649 Anemia, unspecified: Secondary | ICD-10-CM | POA: Insufficient documentation

## 2015-07-26 LAB — URINE CULTURE: Special Requests: NORMAL

## 2015-07-26 LAB — BASIC METABOLIC PANEL
ANION GAP: 7 (ref 5–15)
BUN: 11 mg/dL (ref 6–20)
CHLORIDE: 102 mmol/L (ref 101–111)
CO2: 26 mmol/L (ref 22–32)
Calcium: 7.5 mg/dL — ABNORMAL LOW (ref 8.9–10.3)
Creatinine, Ser: 0.68 mg/dL (ref 0.44–1.00)
Glucose, Bld: 86 mg/dL (ref 65–99)
POTASSIUM: 3.4 mmol/L — AB (ref 3.5–5.1)
SODIUM: 135 mmol/L (ref 135–145)

## 2015-07-26 LAB — MAGNESIUM: MAGNESIUM: 2.1 mg/dL (ref 1.7–2.4)

## 2015-07-26 LAB — PHOSPHORUS: PHOSPHORUS: 2.3 mg/dL — AB (ref 2.5–4.6)

## 2015-07-26 MED ORDER — DEXTROSE 5 % IV SOLN
10.0000 mmol | Freq: Once | INTRAVENOUS | Status: DC
  Filled 2015-07-26: qty 3.33

## 2015-07-26 MED ORDER — ERTAPENEM SODIUM 1 G IJ SOLR: 1.0000 g | INTRAMUSCULAR | Status: DC

## 2015-07-26 MED ORDER — POTASSIUM PHOSPHATES 15 MMOLE/5ML IV SOLN
10.0000 mmol | Freq: Once | INTRAVENOUS | Status: DC
  Filled 2015-07-26: qty 3.33

## 2015-07-26 MED ORDER — SODIUM CHLORIDE 0.9 % IV SOLN
1.0000 g | INTRAVENOUS | Status: DC
  Administered 2015-07-26: 1 g via INTRAVENOUS
  Filled 2015-07-26 (×4): qty 1

## 2015-07-26 MED ORDER — POTASSIUM PHOSPHATES 15 MMOLE/5ML IV SOLN
10.0000 mmol | Freq: Once | INTRAVENOUS | Status: AC
  Administered 2015-07-26: 10 mmol via INTRAVENOUS
  Filled 2015-07-26: qty 3.33

## 2015-07-26 NOTE — Clinical Social Work Note (Signed)
Clinical Social Work Assessment  Patient Details  Name: Anna MullDoris B Zepeda MRN: [id number] Date of Birth: [birth year]  Date of referral:  07/26/15               Reason for consult:  Facility Placement                Permission sought to share information with:  Facility Medical sales representativeContact Representative, Family Supports Permission granted to share information::  Yes, Verbal Permission Granted  Name::      (Dau. Jan  [phone] and Bosie ClosJudith [phone])  Agency::     Relationship::     Contact Information:     Housing/Transportation Living arrangements for the past 2 months:  Single Family Home Source of Information:  Medical Team, Power of [place], Adult Children Patient Interpreter Needed:  None Criminal Activity/Legal Involvement Pertinent to Current Situation/Hospitalization:    Significant Relationships:  Adult Children, Community Support Lives with:    Do you feel safe going back to the place where you live?    Need for family participation in patient care:  Yes (Comment)  Care giving concerns:  None at this time   Office managerocial Worker assessment / plan:  Visual merchandiserClinical Social Worker (CSW) consult for possible SNF placement, PT is recommending STR to assist with getting patient back to previous level of functioning.   Patient was alert and participating in PT.  (Additional Information provided by daughter Jan).  CSW introduced self and explained role of CSW department.  Patient currently lives with her daughter Bosie ClosJudith [phone] .  She has a 7 hour care taker  Ms. Pattie that cares for patient while her daughters work.   CSW explained that PT is recommending rehab.  Patient has been to SNF. CSW reviewed SNF process with patient, Patient is agreeable to SNF search and prefers [facility].    Patient's insurance will require auth.  CSW will complete FL2, and fax out for available bed in anticipation of discharging to SNF for rehab.   Employment status:  Disabled (Comment on whether or not currently  receiving Disability) Insurance information:  Managed Medicare PT Recommendations:  Skilled Nursing Facility Information / Referral to community resources:     Patient/Family's Response to care:  Patient was appreciative of information provided by CSW/  Patient/Family's Understanding of and Emotional Response to Diagnosis, Current Treatment, and Prognosis: Patient  And daughter understands that she is under continued medical work up at this time.  Once medically stable patient will discharge to SNF.  Emotional Assessment Appearance:  Appears stated age Attitude/Demeanor/Rapport:    Affect (typically observed):  Accepting, Pleasant, Calm Orientation:  Oriented to Self Alcohol / Substance use:    Psych involvement (Current and /or in the community):  No (Comment)  Discharge Needs  Concerns to be addressed:  Care Coordination, Discharge Planning Concerns Readmission within the last 30 days:  No Current discharge risk:  Dependent with Mobility, Chronically ill Barriers to Discharge:  Continued Medical Work up, [employer] Authorization   Soundra PilonMoore, Andrea Ferrer H, LCSW 07/26/2015, 9:20 AM

## 2015-07-26 NOTE — Progress Notes (Signed)
Jason in pharmacy re-scheduled patients morning iv meds since no iv access at this time. They are in the process of inserting picc line.

## 2015-07-26 NOTE — NC FL2 (Signed)
[place] MEDICAID FL2 LEVEL OF CARE SCREENING TOOL     IDENTIFICATION  Patient Name: Anna [name] Birthdate: May 12, 1922 Sex: female Admission Date (Current Location): 07/24/2015  [place]   Facility and Address:  [facility], [address], [place], [place] [address]      Provider Number: [id number]  Attending Physician Name and Address:  Adrian SaranSital Mody, MD  Relative Name and Phone Number:       Current Level of Care: Hospital Recommended Level of Care: Skilled Nursing Facility Prior Approval Number:    Date Approved/Denied:   PASRR Number:  [id number])  Discharge Plan: SNF    Current Diagnoses: Patient Active Problem List   Diagnosis Date Noted  . Weakness 07/24/2015  . Sacral decubitus ulcer 07/24/2015  . Norovirus 07/24/2015  . Bereavement 12/14/2012  . Impaired gait 12/14/2012  . Vitamin D deficiency 03/09/2012  . GERD (gastroesophageal reflux disease) 12/29/2011  . Iron deficiency anemia 12/29/2011  . Memory loss 12/29/2011  . Anxiety 07/09/2011  . Hyperlipidemia 07/01/2011  . Hypertension 07/01/2011    Orientation RESPIRATION BLADDER Height & Weight    Self, Place  Normal Incontinent 5' (152.4 cm) 136 lbs.  BEHAVIORAL SYMPTOMS/MOOD NEUROLOGICAL BOWEL NUTRITION STATUS      Incontinent Diet (soft diet)  AMBULATORY STATUS COMMUNICATION OF NEEDS Skin   Extensive Assist Verbally PU Stage and Appropriate Care                       Personal Care Assistance Level of Assistance  Bathing, Feeding, Dressing Bathing Assistance: Limited assistance Feeding assistance: Limited assistance Dressing Assistance: Limited assistance     Functional Limitations Info  Sight, Hearing, Speech Sight Info: Adequate Hearing Info: Adequate Speech Info: Adequate    SPECIAL CARE FACTORS FREQUENCY  PT (By licensed PT)                    Contractures      Additional Factors Info  Allergies,  Isolation Precautions   Allergies Info:  (Ciprofloxacin, Macrobid)     Isolation Precautions Info:  (ESBL pos. Ecoli)     Current Medications (07/26/2015):  This is the current hospital active medication list Current Facility-Administered Medications  Medication Dose Route Frequency Provider Last Rate Last Dose  . 0.9 %  sodium chloride infusion   Intravenous Once Loleta Roseory Forbach, MD      . 0.9 % NaCl with KCl 20 mEq/ L  infusion   Intravenous Continuous Wyatt Hasteavid K Hower, MD 50 mL/hr at 07/25/15 [phone]    . acetaminophen (TYLENOL) tablet 650 mg  650 mg Oral Q6H PRN Wyatt Hasteavid K Hower, MD       Or  . acetaminophen (TYLENOL) suppository 650 mg  650 mg Rectal Q6H PRN Wyatt Hasteavid K Hower, MD      . aspirin EC tablet 81 mg  81 mg Oral Daily Wyatt Hasteavid K Hower, MD   81 mg at 07/25/15 1115  . ertapenem (INVANZ) 1 g in sodium chloride 0.9 % 50 mL IVPB  1 g Intravenous Q24H Sital Mody, MD      . lisinopril (PRINIVIL,ZESTRIL) tablet 2.5 mg  2.5 mg Oral Daily Wyatt Hasteavid K Hower, MD   2.5 mg at 07/25/15 1114  . loratadine (CLARITIN) tablet 10 mg  10 mg Oral Daily Wyatt Hasteavid K Hower, MD   10 mg at 07/25/15 1115  . metoprolol tartrate (LOPRESSOR) tablet 12.5 mg  12.5 mg Oral BID Wyatt Hasteavid K Hower, MD  12.5 mg at 07/25/15 2154  . montelukast (SINGULAIR) tablet 10 mg  10 mg Oral Daily Wyatt Haste, MD   10 mg at 07/25/15 1114  . morphine 2 MG/ML injection 2 mg  2 mg Intravenous Q4H PRN Wyatt Haste, MD      . naproxen (NAPROSYN) tablet 250 mg  250 mg Oral BID PRN Wyatt Haste, MD      . ondansetron [facility]) tablet 4 mg  4 mg Oral Q6H PRN Wyatt Haste, MD       Or  . ondansetron [facility]) injection 4 mg  4 mg Intravenous Q6H PRN Wyatt Haste, MD      . oxyCODONE (Oxy IR/ROXICODONE) immediate release tablet 5 mg  5 mg Oral Q4H PRN Wyatt Haste, MD      . pantoprazole (PROTONIX) EC tablet 40 mg  40 mg Oral Daily Wyatt Haste, MD   40 mg at 07/25/15 1115  . potassium phosphate 10 mmol in dextrose 5 % 250 mL infusion  10 mmol Intravenous  Once Adrian Saran, MD      . simvastatin (ZOCOR) tablet 10 mg  10 mg Oral QHS Wyatt Haste, MD   10 mg at 07/25/15 2155     Discharge Medications: Please see discharge summary for a list of discharge medications.  Relevant Imaging Results:  Relevant Lab Results:   Additional Information  (ZO:[id number])  Soundra Pilon, LCSW

## 2015-07-26 NOTE — Evaluation (Signed)
Physical Therapy Evaluation Patient Details Name: Anna [name] MRN: [id number] DOB: 11-03-1921 Today's Date: 07/26/2015   History of Present Illness  Patient is a 82 y/o female that presents with diarrhea and sacral ulcer. She has R facial droop and R sided weakness from old CVA.   Clinical Impression  Patient is a 82 y/o female that has a history of old CVA decreasing her R sided strength in both UE and LE. She has typically required assistance to perform bed mobility as well as transfers to and from recliner as well as to and from bedside commode. Per family report she has demonstrated progressive weakness over the last 2-3 weeks, where she now requires 2-person assistance at home to transfer to bedside commode safely. In this session she requires max A x1 to perform bed mobility, and requires min A x1 for hand placement, cuing, and balance assistance to perform "stand-pivot" transfer (patient unable to come fully upright). While no significant physical assistance provided, patient did require assistance for balance throughout transfer (which does not seem to be the case at baseline). Given her clinical picture of sacral ulcer, PICC line, etc. She would certainly benefit from skilled PT services to increase her balance and independence with mobility. This appears near her baseline, however she does demonstrate the potential to take steps, stand more independently, and complete transfers without assistance for balance.     Follow Up Recommendations SNF (Pending progress)    Equipment Recommendations       Recommendations for Other Services       Precautions / Restrictions Precautions Precautions: Fall Restrictions Weight Bearing Restrictions: No      Mobility  Bed Mobility Overal bed mobility: Needs Assistance Bed Mobility: Supine to Sit     Supine to sit: Max assist     General bed mobility comments: Patient cued to use bed rail with LUE (required significant assistance to do  so) as well as max A to bring to EOB.   Transfers Overall transfer level: Needs assistance Equipment used: 1 person hand held assist Transfers: Stand Pivot Transfers   Stand pivot transfers: Min guard;Min assist       General transfer comment: Patient cued for hand placement during transfer for use of chair arm rails, however she was unable to become fully erect without assistance. She attempts to shuffle her feet and requires min A x1 to maintain her balance.   Ambulation/Gait                Stairs            Wheelchair Mobility    Modified Rankin (Stroke Patients Only)       Balance Overall balance assessment: Needs assistance Sitting-balance support: No upper extremity supported Sitting balance-Leahy Scale: Fair Sitting balance - Comments: No significant lean noted, able to sit independently without UE assistance.      Standing balance-Leahy Scale: Poor Standing balance comment: Patient unable to become fully erect and must reach for chair arm rails during transfer for assistance.                              Pertinent Vitals/Pain Pain Assessment: No/denies pain (Initially uncomfortable on her ulcer in sitting, assisted with repositioning, patient stated she was more comfortable. )    Home Living Family/patient expects to be discharged to:: Private residence Living Arrangements: Children Available Help at Discharge: Family Type of Home: House Home Access: Level entry  Home Layout: One level Home Equipment: Wheelchair - manual;Bedside commode;Walker - 2 wheels;Transport chair;Hospital bed      Prior Function Level of Independence: Needs assistance         Comments: Patient non-ambulatory at baseline, utilizes manual WC as primary mobility within the home (unable to self-propel).  Assist for all bed mobility, transfers and ADLs; performs basic transfers via SPT (appears she is able to use her LUE and pivot into and out of car and  in/out of bed to chair and commode with cga-supervision.) without assist device (unable to take steps, advance LEs; simply pivots once upright).     Hand Dominance   Dominant Hand: Right    Extremity/Trunk Assessment   Upper Extremity Assessment: RUE deficits/detail RUE Deficits / Details: Remains fairly flaccid throughout exam, she has deficits from previous stroke. LUE able to grip on to bed rail and hand rails.          Lower Extremity Assessment: RLE deficits/detail RLE Deficits / Details: Unable to complete SLR/LAQ without assistance (minimal), able to flex in supine though slowly.        Communication   Communication: Expressive difficulties (Dysarthric)  Cognition Arousal/Alertness: Awake/alert Behavior During Therapy: WFL for tasks assessed/performed Overall Cognitive Status: History of cognitive impairments - at baseline                      General Comments      Exercises General Exercises - Lower Extremity Long Arc Quad: AROM;AAROM;10 reps (2 sets) Straight Leg Raises: AROM;AAROM;10 reps;Both (2 sets)      Assessment/Plan    PT Assessment Patient needs continued PT services  PT Diagnosis Difficulty walking;Generalized weakness;Hemiplegia dominant side   PT Problem List Decreased strength;Decreased mobility;Decreased balance  PT Treatment Interventions Therapeutic activities;Therapeutic exercise;Balance training;Gait training;Patient/family education   PT Goals (Current goals can be found in the Care Plan section) Acute Rehab PT Goals Patient Stated Goal: Family would like for patient to go to SNF as they have noted decline in strength at home requiring 2 person assist for transfers.  PT Goal Formulation: With family Time For Goal Achievement: 08/09/15 Potential to Achieve Goals: Good    Frequency Min 2X/week   Barriers to discharge        Co-evaluation               End of Session Equipment Utilized During Treatment: Gait  belt Activity Tolerance: Patient tolerated treatment well Patient left: in chair;with chair alarm set;with call bell/phone within reach;with family/visitor present Nurse Communication: Mobility status    Functional Assessment Tool Used: Clinical judgement, patient history per family  Functional Limitation: Mobility: Walking and moving around Mobility: Walking and Moving Around Current Status [phone](G8978): At least 80 percent but less than 100 percent impaired, limited or restricted Mobility: Walking and Moving Around Goal Status [phone](G8979): At least 80 percent but less than 100 percent impaired, limited or restricted    Time: 0856-0926 PT Time Calculation (min) (ACUTE ONLY): 30 min   Charges:   PT Evaluation $PT Eval Moderate Complexity: 1 Procedure     PT G Codes:   PT G-Codes **NOT FOR INPATIENT CLASS** Functional Assessment Tool Used: Clinical judgement, patient history per family  Functional Limitation: Mobility: Walking and moving around Mobility: Walking and Moving Around Current Status [phone](G8978): At least 80 percent but less than 100 percent impaired, limited or restricted Mobility: Walking and Moving Around Goal Status [phone](G8979): At least 80 percent but less than 100 percent impaired,  limited or restricted    Kerin Ransom, PT, DPT    07/26/2015, 9:46 AM

## 2015-07-26 NOTE — Progress Notes (Deleted)
Discharge instructions reviewed with the patent and his wife.  Pt inquiring about assistance building and elevated porch.  Recommendation made for him to call [facility] about this since they used to help build porches.  He inquired about a smaller portable oxygen machine.  I spoke with care manager who said pt would have to discuss this with home oxygen provider.  IV removed.  RX electronically sent to [facility] 

## 2015-07-26 NOTE — Progress Notes (Signed)
Received patient at 3pm from Audree BaneAudry Rodrigues. Patient lost peripheral IV in wrist and morning meds were not given. Awaiting picc line to be inserted/schedule keeps getting pushed back. Called Jason in pharmacy & doses were re-scheduled for 6pm.

## 2015-07-26 NOTE — Progress Notes (Addendum)
Call from Vernona RiegerLaura [facility].  Provided auth number [id number]  For patient to go to SNF.  Auth provided to [facility]. Next review date is Jan 17th  CSW will continue to follow for discharge needs.  Sammuel Hineseborah Paco Cislo. LCSWA Clinical Social Work Department [phone] 3:56 PM

## 2015-07-26 NOTE — Progress Notes (Signed)
Pharmacy Consult for Electrolyte Monitoring   Allergies  Allergen Reactions  . Ciprofloxacin Nausea And Vomiting  . Macrobid [Nitrofurantoin Monohydrate Macrocrystals] Nausea And Vomiting    Patient Measurements: Height: 5' (152.4 cm) Weight: 136 lb 1.6 oz (61.735 kg) IBW/kg (Calculated) : 45.5  Vital Signs: Temp: 98.4 F (36.9 C) (01/13 0908) Temp Source: Oral (01/13 0908) BP: 154/62 mmHg (01/13 0908) Pulse Rate: 79 (01/13 0908) Intake/Output from previous day: 01/12 0701 - 01/13 0700 In: 791.7 [I.V.:791.7] Out: -  Intake/Output from this shift:    Labs:  Recent Labs  07/24/15 1332 07/25/15 0908  WBC 4.9 5.4  HGB 12.3 12.1  HCT 39.8 38.3  PLT 247 220     Recent Labs  07/24/15 1332 07/25/15 0908 07/26/15 0809  NA 139 139 135  K 3.4* 4.3 3.4*  CL 101 106 102  CO2 33* 24 26  GLUCOSE 106* 95 86  BUN 19 15 11   CREATININE 0.71 0.61 0.68  CALCIUM 7.7* 7.7* 7.5*  MG  --  1.6* 2.1  PHOS  --   --  2.3*  PROT 5.7*  --   --   ALBUMIN 2.2*  --   --   AST 20  --   --   ALT 13*  --   --   ALKPHOS 82  --   --   BILITOT 0.9  --   --    Estimated Creatinine Clearance: 37.6 mL/min (by C-G formula based on Cr of 0.68).   No results for input(s): GLUCAP in the last 72 hours.  Medical History: Past Medical History  Diagnosis Date  . Hypertension   . GERD (gastroesophageal reflux disease)   . Allergy   . Hyperlipidemia   . Anxiety   . Osteoporosis   . Arthritis     DEGENERATIVE; AFFECTING R KNEE  . Palpitations   . Lower GI bleed 2005 2009    SECONDARY TO DIVERTICULOSIS  . Hyperglycemia   . Recurrent UTI   . Recurrent aspiration bronchitis/pneumonia (HCC)   . Diverticulosis   . Bladder prolapse   . CVA (cerebral infarction)     Medications:  Scheduled:  . sodium chloride   Intravenous Once  . aspirin EC  81 mg Oral Daily  . ertapenem  1 g Intravenous Q24H  . lisinopril  2.5 mg Oral Daily  . loratadine  10 mg Oral Daily  . metoprolol tartrate   12.5 mg Oral BID  . montelukast  10 mg Oral Daily  . pantoprazole  40 mg Oral Daily  . potassium phosphate IVPB (mmol)  10 mmol Intravenous Once  . simvastatin  10 mg Oral QHS   Infusions:  . 0.9 % NaCl with KCl 20 mEq / L 50 mL/hr at 07/25/15 [id number]    Assessment: Pharmacy consulted to assist in managing electrolytes in this 82 y/o F with gastroenteritis and UTI. Patient is currently receiving NS w/ KCL 20 meq/L at 50 ml/hr.   Plan:  Potassium and phosphorus are both slightly low so will replace with potassium phosphate 10 mmol x 1 and check am labs.   Anna [name], Anna [name] D 07/26/2015,9:40 AM

## 2015-07-26 NOTE — Progress Notes (Signed)
Clinical to start initial SNF authorization sent to [facility] at [phone] at 10:30am.  Update called into John with [facility] [phone]) to relay SNF choice ([facility]) at 11:04am.  Jonny RuizJohn relayed that Vernona RiegerLaura was rep handling the case and that he would document SNF choice so she would be aware.  Vernona RiegerLaura can be reached at [phone] ext. [phone].

## 2015-07-26 NOTE — Progress Notes (Signed)
[facility] - [place] at [facility]   PATIENT NAME: Anna SingletonDoris [name]    MR#:  [id number]  DATE OF BIRTH:  07/05/1922  SUBJECTIVE:  Patient did not want to work with physical therapy yesterday. She agrees to physical therapy today. Diarrhea has improved. Daughter is at bedside.  REVIEW OF SYSTEMS:    Review of Systems  Constitutional: Negative for fever, chills and malaise/fatigue.  HENT: Negative for sore throat.   Eyes: Negative for blurred vision.  Respiratory: Negative for cough, hemoptysis, shortness of breath and wheezing.   Cardiovascular: Negative for chest pain, palpitations and leg swelling.  Gastrointestinal: Negative for nausea, vomiting, abdominal pain, diarrhea (better) and blood in stool.  Genitourinary: Negative for dysuria.  Musculoskeletal: Negative for back pain.  Skin:       On sacrum from diarrea  Neurological: Negative for dizziness, tremors and headaches.  Endo/Heme/Allergies: Does not bruise/bleed easily.    Tolerating Diet: Yes      DRUG ALLERGIES:   Allergies  Allergen Reactions  . Ciprofloxacin Nausea And Vomiting  . Macrobid [Nitrofurantoin Monohydrate Macrocrystals] Nausea And Vomiting    VITALS:  Blood pressure 154/62, pulse 79, temperature 98.4 F (36.9 C), temperature source Oral, resp. rate 18, height 5' (1.524 m), weight 61.735 kg (136 lb 1.6 oz), SpO2 98 %.  PHYSICAL EXAMINATION:   Physical Exam  Constitutional: She is well-developed, well-nourished, and in no distress. No distress.  HENT:  Head: Normocephalic.  Eyes: No scleral icterus.  Neck: Normal range of motion. Neck supple. No JVD present. No tracheal deviation present.  Cardiovascular: Normal rate, regular rhythm and normal heart sounds.  Exam reveals no gallop and no friction rub.   No murmur heard. Pulmonary/Chest: Effort normal and breath sounds normal. No respiratory distress. She has no wheezes. She has no rales. She exhibits no tenderness.   Abdominal: Soft. Bowel sounds are normal. She exhibits no distension and no mass. There is no tenderness. There is no rebound and no guarding.  Musculoskeletal: Normal range of motion. She exhibits no edema.  Neurological: She is alert.  She has baseline right facial droop from old stroke and right-sided weakness  Skin: Skin is warm. No rash noted. No erythema.  Psychiatric: Affect and judgment normal.      LABORATORY PANEL:   CBC  Recent Labs Lab 07/25/15 0908  WBC 5.4  HGB 12.1  HCT 38.3  PLT 220   ------------------------------------------------------------------------------------------------------------------  Chemistries   Recent Labs Lab 07/24/15 1332  07/26/15 0809  NA 139  < > 135  K 3.4*  < > 3.4*  CL 101  < > 102  CO2 33*  < > 26  GLUCOSE 106*  < > 86  BUN 19  < > 11  CREATININE 0.71  < > 0.68  CALCIUM 7.7*  < > 7.5*  MG  --   < > 2.1  AST 20  --   --   ALT 13*  --   --   ALKPHOS 82  --   --   BILITOT 0.9  --   --   < > = values in this interval not displayed. ------------------------------------------------------------------------------------------------------------------  Cardiac Enzymes No results for input(s): TROPONINI in the last 168 hours. ------------------------------------------------------------------------------------------------------------------  RADIOLOGY:  No results found.   ASSESSMENT AND PLAN:   82 year old female with a history of CVA with residual right-sided weakness who presents with diarrhea.  1. Norvasc diarrhea: Patient's C. difficile was negative however positive for NOROVORUS. Treatment is supportive. Continue  with IV fluids and antiemetics if needed and anti-diarrheal medications.   2. ESBL UTI: Patient will have PICC line placed and need IV ertapenem. continue with isolation   3. Electrolyte abnormalities: Improved.  4. Sacral moisture associated skin damage from diarrhea present on admission: Cleanse sacral  area with soap and water and pat dry. Apply barrier cream twice daily and after incontinence. No disposable briefs or underpads.  5 Essential hypertension: Continue lisinopril and Lopressor.  6. History of stroke with residual deficits: Continue aspirin and simvastatin.   Management plans discussed with the patient and she is in agreement.  CODE STATUS: Full  TOTAL TIME TAKING CARE OF THIS PATIENT: 28 minutes.   Then discussed with daughter at bedside. PT consultation for evaluation for disposition.  POSSIBLE D/C tomorrow, DEPENDING ON CLINICAL CONDITION. Patient will need insurance approval for disposition if she will be going to skilled nursing facility.  Rhianon Zabawa M.D on 07/26/2015 at 9:37 AM  Between 7am to 6pm - Pager - [phone] After 6pm go to [email or web address] - password [email or web address]  [facility]  Office  [phone]  CC: Primary care physician; Wynona Dove, MD  Note: This dictation was prepared with Dragon dictation along with smaller phrase technology. Any transcriptional errors that result from this process are unintentional.

## 2015-07-27 LAB — BASIC METABOLIC PANEL
ANION GAP: 1 — AB (ref 5–15)
BUN: 10 mg/dL (ref 6–20)
CALCIUM: 7.2 mg/dL — AB (ref 8.9–10.3)
CHLORIDE: 106 mmol/L (ref 101–111)
CO2: 32 mmol/L (ref 22–32)
Creatinine, Ser: 0.59 mg/dL (ref 0.44–1.00)
GFR calc non Af Amer: >60 mL/min (ref >60)
Glucose, Bld: 90 mg/dL (ref 65–99)
Potassium: 3.2 mmol/L — ABNORMAL LOW (ref 3.5–5.1)
Sodium: 139 mmol/L (ref 135–145)

## 2015-07-27 LAB — PHOSPHORUS: PHOSPHORUS: 3.2 mg/dL (ref 2.5–4.6)

## 2015-07-27 LAB — MAGNESIUM: MAGNESIUM: 2 mg/dL (ref 1.7–2.4)

## 2015-07-27 MED ORDER — SODIUM CHLORIDE 0.9 % IV SOLN: 1.0000 g | INTRAVENOUS | Status: AC

## 2015-07-27 MED ORDER — ACIDOPHILUS PROBIOTIC 10 MG PO TABS: 10.0000 mg | ORAL_TABLET | Freq: Two times a day (BID) | ORAL | Status: AC

## 2015-07-27 NOTE — Progress Notes (Signed)
Clinical Social Worker informed by Anna SaranSital Mody, MD that patient is medically ready to discharge to SNF, Patient and  Daughter Anna [name] at bedside are in a agreement with plan.  [facility] that patient's bed is ready. Provided patient's room number 224 and number to call for report [phone] . All discharge information faxed to  Facility. Rx's added to discharge packet.   RN will call report and patient will discharge [facility].  Anna Hineseborah Jilliane Kazanjian. LCSWA Clinical Social Work Department [phone] 10:33 AM

## 2015-07-27 NOTE — Discharge Summary (Signed)
[facility] [place] [facility]   PATIENT NAME: Anna SingletonDoris [name]    MR#:  [id number]  DATE OF BIRTH:  Sep 08, 1921  DATE OF ADMISSION:  07/24/2015 ADMITTING PHYSICIAN: Wyatt Hasteavid K Hower, MD  DATE OF DISCHARGE: 07/27/2015  PRIMARY CARE PHYSICIAN: Wynona DoveWALKER,JENNIFER AZBELL, MD    ADMISSION DIAGNOSIS:  UTI (lower urinary tract infection) [N39.0] Volume depletion, gastrointestinal loss [E86.9] Enteritis due to Norovirus [A08.11] Generalized weakness [R53.1]  DISCHARGE DIAGNOSIS:  Active Problems:   Weakness   Sacral decubitus ulcer   Norovirus   SECONDARY DIAGNOSIS:   Past Medical History  Diagnosis Date  . Hypertension   . GERD (gastroesophageal reflux disease)   . Allergy   . Hyperlipidemia   . Anxiety   . Osteoporosis   . Arthritis     DEGENERATIVE; AFFECTING R KNEE  . Palpitations   . Lower GI bleed 2005 2009    SECONDARY TO DIVERTICULOSIS  . Hyperglycemia   . Recurrent UTI   . Recurrent aspiration bronchitis/pneumonia (HCC)   . Diverticulosis   . Bladder prolapse   . CVA (cerebral infarction)     HOSPITAL COURSE:   82 year old female with a history of CVA with residual right-sided weakness who presents with diarrhea.  1. Norvasc diarrhea: Patient's C. difficile was negative however positive for NOROVORUS. Treatment is supportive. Her diarrhea has improved.  2. ESBL UTI: Patient has PICC line placed and needs IV ertapenem. Stop date January 22  3. Electrolyte abnormalities: Improved.  4. Sacral moisture associated skin damage from diarrhea present on admission: Cleanse sacral area with soap and water and pat dry. Apply barrier cream twice daily and after incontinence. No disposable briefs or underpads.  5 Essential hypertension: Continue lisinopril and Lopressor.  6. History of stroke with residual deficits: Continue aspirin and simvastatin  DISCHARGE CONDITIONS AND DIET:   Stable for discharge on heart healthy diet  CONSULTS  OBTAINED:  Treatment Team:  Wyatt Hasteavid K Hower, MD  DRUG ALLERGIES:   Allergies  Allergen Reactions  . Ciprofloxacin Nausea And Vomiting  . Macrobid [Nitrofurantoin Monohydrate Macrocrystals] Nausea And Vomiting    DISCHARGE MEDICATIONS:   Current Discharge Medication List    START taking these medications   Details  ertapenem 1 g in sodium chloride 0.9 % 50 mL Inject 1 g into the vein daily. Stop date 1/22 Qty: 13 g, Refills: 0    Lactobacillus (ACIDOPHILUS PROBIOTIC) 10 MG TABS Take 10 mg by mouth 2 (two) times daily. Qty: 30 tablet, Refills: 0      CONTINUE these medications which have NOT CHANGED   Details  aspirin EC 81 MG tablet Take 81 mg by mouth daily.      cetirizine (ZYRTEC) 10 MG tablet Take 10 mg by mouth at bedtime.    lisinopril (PRINIVIL,ZESTRIL) 2.5 MG tablet Take 2.5 mg by mouth daily.    metoprolol tartrate (LOPRESSOR) 25 MG tablet Take 12.5 mg by mouth 2 (two) times daily.    montelukast (SINGULAIR) 10 MG tablet Take 10 mg by mouth daily.    naproxen sodium (ANAPROX) 220 MG tablet Take 220 mg by mouth 2 (two) times daily as needed (for pain).    nystatin cream (MYCOSTATIN) Apply 1 application topically 3 (three) times daily as needed (for itching/irritation).    pantoprazole (PROTONIX) 40 MG tablet Take 1 tablet (40 mg total) by mouth daily. Qty: 30 tablet, Refills: 6   Associated Diagnoses: GERD (gastroesophageal reflux disease)    simvastatin (ZOCOR) 10 MG tablet Take 10  mg by mouth at bedtime.      STOP taking these medications     cefUROXime (CEFTIN) 250 MG tablet               Today   CHIEF COMPLAINT:  No acute issues. Diarrhea is resolving.   VITAL SIGNS:  Blood pressure 135/60, pulse 75, temperature 97.7 F (36.5 C), temperature source Oral, resp. rate 16, height 5' (1.524 m), weight 61.735 kg (136 lb 1.6 oz), SpO2 96 %.   REVIEW OF SYSTEMS:  Review of Systems  Constitutional: Negative for fever, chills and  malaise/fatigue.  HENT: Negative for sore throat.   Eyes: Negative for blurred vision.  Respiratory: Negative for cough, hemoptysis, shortness of breath and wheezing.   Cardiovascular: Negative for chest pain, palpitations and leg swelling.  Gastrointestinal: Negative for nausea, vomiting, abdominal pain, diarrhea and blood in stool.  Genitourinary: Negative for dysuria.  Musculoskeletal: Negative for back pain.  Neurological: Negative for dizziness, tremors and headaches.  Endo/Heme/Allergies: Does not bruise/bleed easily.     PHYSICAL EXAMINATION:  GENERAL:  82 y.o.-year-old patient lying in the bed with no acute distress.  NECK:  Supple, no jugular venous distention. No thyroid enlargement, no tenderness.  LUNGS: Normal breath sounds bilaterally, no wheezing, rales,rhonchi  No use of accessory muscles of respiration.  CARDIOVASCULAR: S1, S2 normal. No murmurs, rubs, or gallops.  ABDOMEN: Soft, non-tender, non-distended. Bowel sounds present. No organomegaly or mass.  EXTREMITIES: No pedal edema, cyanosis, or clubbing.  PSYCHIATRIC: The patient is alert and oriented x 3.  SKIN: No obvious rash, lesion, or ulcer.  Patient has facial droop and residual deficit on the right side from previous stroke: DATA REVIEW:   CBC  Recent Labs Lab 07/25/15 0908  WBC 5.4  HGB 12.1  HCT 38.3  PLT 220    Chemistries   Recent Labs Lab 07/24/15 1332  07/26/15 0809  NA 139  < > 135  K 3.4*  < > 3.4*  CL 101  < > 102  CO2 33*  < > 26  GLUCOSE 106*  < > 86  BUN 19  < > 11  CREATININE 0.71  < > 0.68  CALCIUM 7.7*  < > 7.5*  MG  --   < > 2.1  AST 20  --   --   ALT 13*  --   --   ALKPHOS 82  --   --   BILITOT 0.9  --   --   < > = values in this interval not displayed.  Cardiac Enzymes No results for input(s): TROPONINI in the last 168 hours.  Microbiology Results  @MICRORSLT48 @  RADIOLOGY:  Dg Chest 1 View  07/26/2015  CLINICAL DATA:  Sacral decubitus ulcer. Repositioning of  left arm PICC line. EXAM: CHEST 1 VIEW COMPARISON:  Prior today FINDINGS: Left arm PICC line has been repositioned, with tip now in the proximal to mid superior vena cava. Both lungs are clear. Mild cardiomegaly stable. IMPRESSION: Left arm PICC line tip in proximal to mid SVC. Mild cardiomegaly.  No active lung disease. Electronically Signed   By: Myles Rosenthal M.D.   On: 07/26/2015 21:32   Dg Chest 1 View  07/26/2015  CLINICAL DATA:  PICC line placement.  Sacral decubitus ulcer. EXAM: CHEST 1 VIEW COMPARISON:  05/15/2015 FINDINGS: Heart size is at the upper limits of normal and stable. Both lungs are clear. A new left arm PICC line is seen with tip curled back on itself within  the left innominate vein. IMPRESSION: Left arm PICC line tip is coronal back on itself within the left innominate vein. No active lung disease.a Electronically Signed   By: Myles Rosenthal M.D.   On: 07/26/2015 20:13      Management plans discussed with the patient and she  is in agreement. Stable for discharge   Patient should follow up with PCP one week  CODE STATUS:     Code Status Orders        Start     Ordered   07/24/15 2257  Full code   Continuous     07/24/15 2256    Code Status History    Date Active Date Inactive Code Status Order ID Comments User Context   05/15/2015 10:20 PM 05/20/2015  6:48 PM Full Code [id number]  Auburn Bilberry, MD Inpatient    Advance Directive Documentation        Most Recent Value   Type of Advance Directive  Out of facility DNR (pink MOST or yellow form)   Pre-existing out of facility DNR order (yellow form or pink MOST form)     "MOST" Form in Place?        TOTAL TIME TAKING CARE OF THIS PATIENT: 35 minutes.    Note: This dictation was prepared with Dragon dictation along with smaller phrase technology. Any transcriptional errors that result from this process are unintentional.  Diar Berkel M.D on 07/27/2015 at 8:47 AM  Between 7am to 6pm - Pager - [phone] After 6pm  go to [email or web address] - password [email or web address] [facility]  [place] [facility]  Office  [phone]  CC: Primary care physician; Wynona Dove, MD

## 2015-07-27 NOTE — Progress Notes (Addendum)
[facility] to [facility]. Patient belongings packed. Daughter bedside to follow patient over to snf. VSS at time of discharge. EMS called.

## 2015-07-27 NOTE — Clinical Social Work Placement (Signed)
   CLINICAL SOCIAL WORK PLACEMENT  NOTE  Date:  07/27/2015  Patient Details  Name: Anna [name] MRN: [id number] Date of Birth: 12-22-1921  Clinical Social Work is seeking post-discharge placement for this patient at the Skilled  Nursing Facility level of care (*CSW will initial, date and re-position this form in  chart as items are completed):  No (family did not want a copy)   Patient/family provided with [facility] Clinical Social Work Department's list of facilities offering this level of care within the geographic area requested by the patient (or if unable, by the patient's family).  Yes   Patient/family informed of their freedom to choose among providers that offer the needed level of care, that participate in Medicare, Medicaid or managed care program needed by the patient, have an available bed and are willing to accept the patient.      Patient/family informed of [place]'s ownership interest in [facility] and [facility], as well as of the fact that they are under no obligation to receive care at these facilities.  PASRR submitted to EDS on       PASRR number received on       Existing PASRR number confirmed on 07/26/15     FL2 transmitted to all facilities in geographic area requested by pt/family on 07/26/15     FL2 transmitted to all facilities within larger geographic area on       Patient informed that his/her managed care company has contracts with or will negotiate with certain facilities, including the following:        Yes   Patient/family informed of bed offers received.  Patient chooses bed at  [facility]     Physician recommends and patient chooses bed at      Patient to be transferred to  [facility] on 07/27/15.  Patient to be transferred to facility by  (EMS)     Patient family notified on 07/27/15 of transfer.  Name of family member notified:   (Daughter Anna [name])     PHYSICIAN       Additional Comment:     _______________________________________________ Soundra PilonMoore, Arlone Lenhardt H, LCSW 07/27/2015, 10:31 AM

## 2015-07-28 NOTE — Progress Notes (Signed)
Pt to be discharged this morning. Pt passed quiet night. No further bleeding from permcath site. Pt is a/o. Ate good breakfast. Given prescrip for pain med. Discharged via w.c. Accompanied by family.

## 2015-07-29 ENCOUNTER — Telehealth: Payer: Self-pay

## 2015-07-29 NOTE — Telephone Encounter (Signed)
Patient's daughter called to clarify Dr. Dan HumphreysWalker is no longer primary care provider.  Will remove.

## 2015-08-01 DIAGNOSIS — E876 Hypokalemia: Secondary | ICD-10-CM | POA: Diagnosis not present

## 2015-08-01 DIAGNOSIS — R410 Disorientation, unspecified: Secondary | ICD-10-CM | POA: Diagnosis not present

## 2015-08-01 DIAGNOSIS — D649 Anemia, unspecified: Secondary | ICD-10-CM | POA: Diagnosis not present

## 2015-08-01 LAB — CBC WITH DIFFERENTIAL/PLATELET
BASOS PCT: 1 %
Basophils Absolute: 0 10*3/uL (ref 0–0.1)
EOS ABS: 0.1 10*3/uL (ref 0–0.7)
Eosinophils Relative: 2 %
HCT: 34.3 % — ABNORMAL LOW (ref 35.0–47.0)
Hemoglobin: 11 g/dL — ABNORMAL LOW (ref 12.0–16.0)
Lymphocytes Relative: 20 %
Lymphs Abs: 0.7 10*3/uL — ABNORMAL LOW (ref 1.0–3.6)
MCH: 23.4 pg — ABNORMAL LOW (ref 26.0–34.0)
MCHC: 32.2 g/dL (ref 32.0–36.0)
MCV: 72.9 fL — ABNORMAL LOW (ref 80.0–100.0)
MONO ABS: 0.3 10*3/uL (ref 0.2–0.9)
MONOS PCT: 7 %
NEUTROS PCT: 70 %
Neutro Abs: 2.5 10*3/uL (ref 1.4–6.5)
PLATELETS: 198 10*3/uL (ref 150–440)
RBC: 4.71 MIL/uL (ref 3.80–5.20)
RDW: 19 % — AB (ref 11.5–14.5)
WBC: 3.5 10*3/uL — ABNORMAL LOW (ref 3.6–11.0)

## 2015-08-01 LAB — MAGNESIUM: Magnesium: 2 mg/dL (ref 1.7–2.4)

## 2015-08-01 LAB — COMPREHENSIVE METABOLIC PANEL
ALBUMIN: 1.8 g/dL — AB (ref 3.5–5.0)
ALT: 19 U/L (ref 14–54)
ANION GAP: 4 — AB (ref 5–15)
AST: 34 U/L (ref 15–41)
Alkaline Phosphatase: 64 U/L (ref 38–126)
BUN: 9 mg/dL (ref 6–20)
CALCIUM: 7.3 mg/dL — AB (ref 8.9–10.3)
CO2: 33 mmol/L — AB (ref 22–32)
Chloride: 102 mmol/L (ref 101–111)
Creatinine, Ser: 0.49 mg/dL (ref 0.44–1.00)
GFR calc Af Amer: >60 mL/min (ref >60)
GFR calc non Af Amer: >60 mL/min (ref >60)
GLUCOSE: 74 mg/dL (ref 65–99)
POTASSIUM: 3.4 mmol/L — AB (ref 3.5–5.1)
SODIUM: 139 mmol/L (ref 135–145)
TOTAL PROTEIN: 4.7 g/dL — AB (ref 6.5–8.1)
Total Bilirubin: 0.8 mg/dL (ref 0.3–1.2)

## 2015-08-01 LAB — VITAMIN B12: Vitamin B-12: 419 pg/mL (ref 180–914)

## 2015-08-01 LAB — TSH: TSH: 1.115 u[IU]/mL (ref 0.350–4.500)

## 2015-08-02 LAB — VITAMIN D 25 HYDROXY (VIT D DEFICIENCY, FRACTURES): Vit D, 25-Hydroxy: 8.7 ng/mL — ABNORMAL LOW (ref 30.0–100.0)

## 2015-08-10 DIAGNOSIS — D649 Anemia, unspecified: Secondary | ICD-10-CM | POA: Diagnosis not present

## 2015-08-10 LAB — URINALYSIS COMPLETE WITH MICROSCOPIC (ARMC ONLY)
Bacteria, UA: NONE SEEN
Bilirubin Urine: NEGATIVE
GLUCOSE, UA: NEGATIVE mg/dL
Hgb urine dipstick: NEGATIVE
KETONES UR: NEGATIVE mg/dL
Leukocytes, UA: NEGATIVE
NITRITE: NEGATIVE
Protein, ur: NEGATIVE mg/dL
SPECIFIC GRAVITY, URINE: 1.017 (ref 1.005–1.030)
Squamous Epithelial / LPF: NONE SEEN
pH: 5 (ref 5.0–8.0)

## 2015-08-12 DIAGNOSIS — D649 Anemia, unspecified: Secondary | ICD-10-CM | POA: Diagnosis not present

## 2015-08-12 LAB — COMPREHENSIVE METABOLIC PANEL
ALK PHOS: 65 U/L (ref 38–126)
ALT: 13 U/L — AB (ref 14–54)
ANION GAP: 4 — AB (ref 5–15)
AST: 15 U/L (ref 15–41)
Albumin: 2 g/dL — ABNORMAL LOW (ref 3.5–5.0)
BUN: 12 mg/dL (ref 6–20)
CALCIUM: 7.7 mg/dL — AB (ref 8.9–10.3)
CO2: 29 mmol/L (ref 22–32)
CREATININE: 0.53 mg/dL (ref 0.44–1.00)
Chloride: 109 mmol/L (ref 101–111)
Glucose, Bld: 95 mg/dL (ref 65–99)
Potassium: 3 mmol/L — ABNORMAL LOW (ref 3.5–5.1)
Sodium: 142 mmol/L (ref 135–145)
TOTAL PROTEIN: 4.9 g/dL — AB (ref 6.5–8.1)
Total Bilirubin: 0.7 mg/dL (ref 0.3–1.2)

## 2015-08-12 LAB — CBC
HCT: 36.5 % (ref 35.0–47.0)
HEMOGLOBIN: 11.5 g/dL — AB (ref 12.0–16.0)
MCH: 24.3 pg — AB (ref 26.0–34.0)
MCHC: 31.6 g/dL — AB (ref 32.0–36.0)
MCV: 76.8 fL — AB (ref 80.0–100.0)
Platelets: 212 10*3/uL (ref 150–440)
RBC: 4.76 MIL/uL (ref 3.80–5.20)
RDW: 22.3 % — ABNORMAL HIGH (ref 11.5–14.5)
WBC: 5.1 10*3/uL (ref 3.6–11.0)

## 2015-08-12 LAB — URINE CULTURE: CULTURE: NO GROWTH

## 2015-08-14 ENCOUNTER — Encounter
Admission: RE | Admit: 2015-08-14 | Discharge: 2015-08-14 | Disposition: A | Discharge status: Home / Self Care | Payer: Medicare Other | Source: Ambulatory Visit | Attending: Internal Medicine | Admitting: Internal Medicine

## 2015-10-12 DEATH — deceased

## 2016-11-04 IMAGING — CT CT ABD-PELV W/ CM
1 of 3 series · 12 of 32 positions shown, 17 images · IV contrast (omnipaque)
Comparison: No priors.

CLINICAL DATA: [AGE] female with history of abdominal pain
and diarrhea for the past 5 days. Prior history of bowel
obstruction.

EXAM:
CT ABDOMEN AND PELVIS WITH CONTRAST
TECHNIQUE: Multidetector CT imaging of the abdomen and pelvis was performed
using the standard protocol following bolus administration of
intravenous contrast.
CONTRAST:  75mL OMNIPAQUE IOHEXOL 300 MG/ML  SOLN

[Series 2: routine abd pel with · axial · 0.72mm/px · z∈[-486,-96]mm · 12 of 88 slices shown, 17 images]
[im 5/88  soft-tissue]
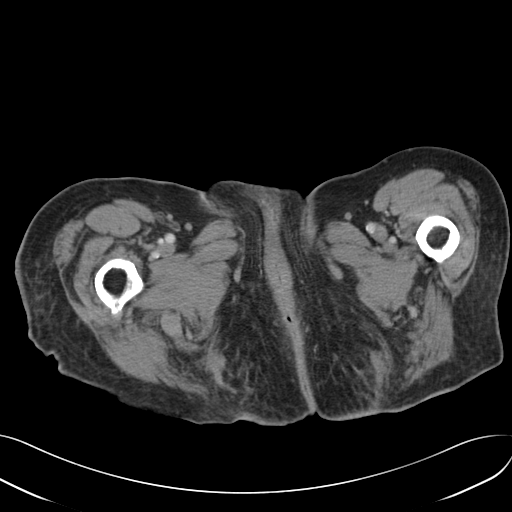
[im 5/88  bone]
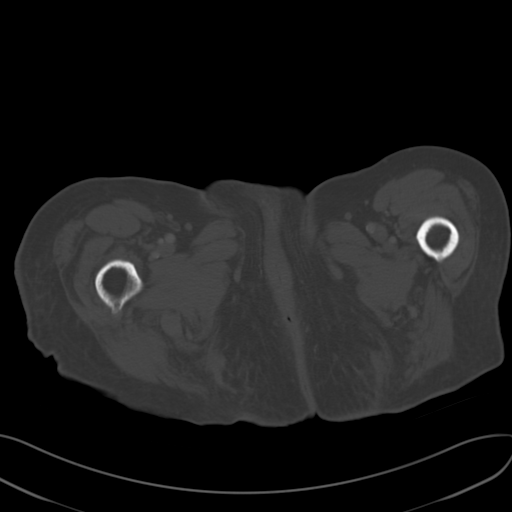
[im 15/88  soft-tissue]
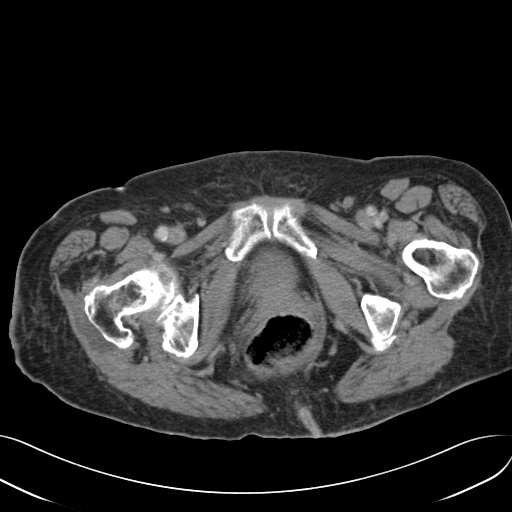
[im 20/88  soft-tissue]
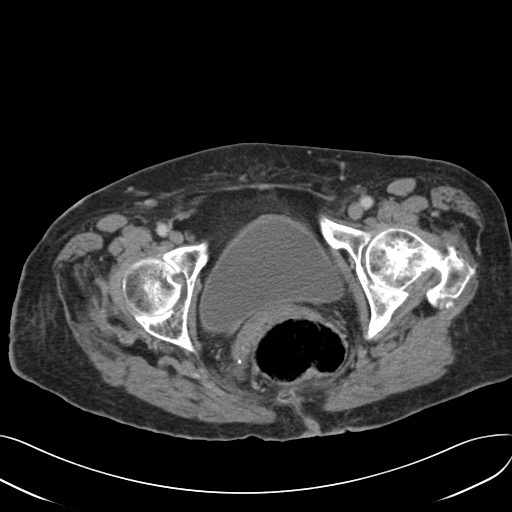
[im 30/88  soft-tissue]
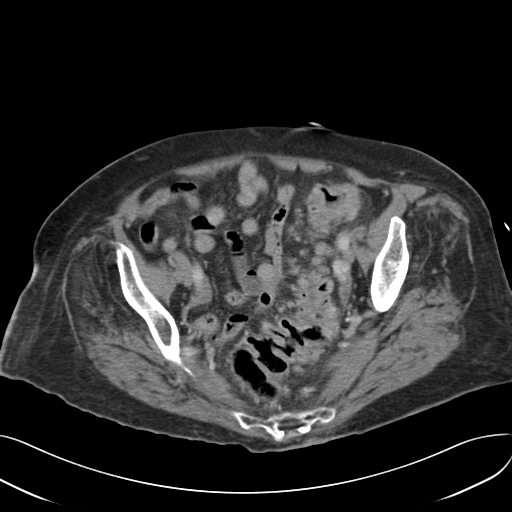
[im 34/88  soft-tissue]
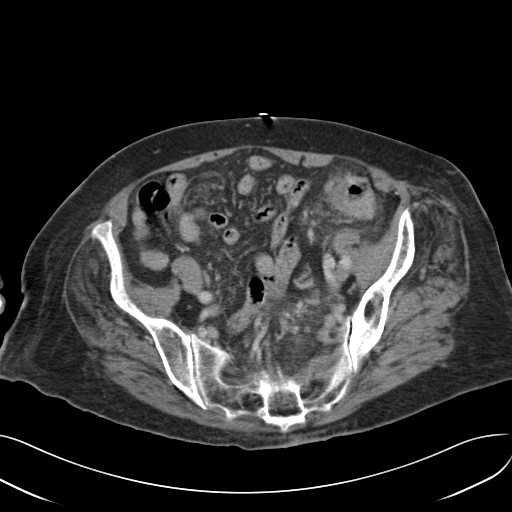
[im 44/88  soft-tissue]
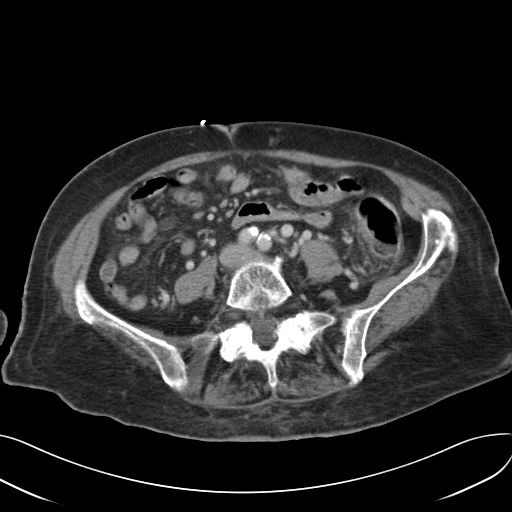
[im 54/88  soft-tissue]
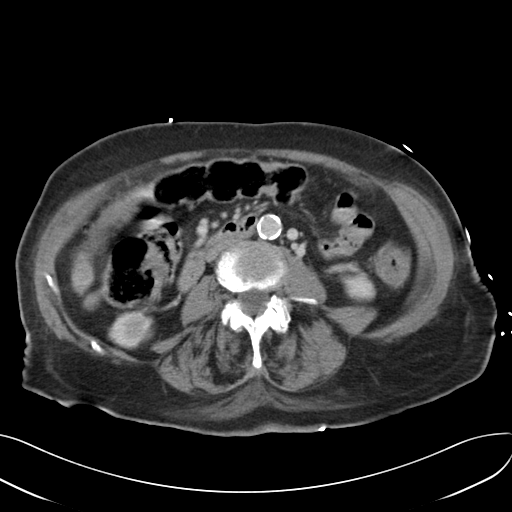
[im 59/88  soft-tissue]
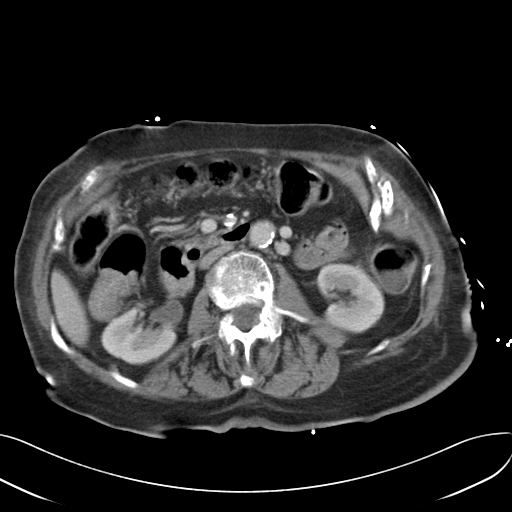
[im 68/88  soft-tissue]
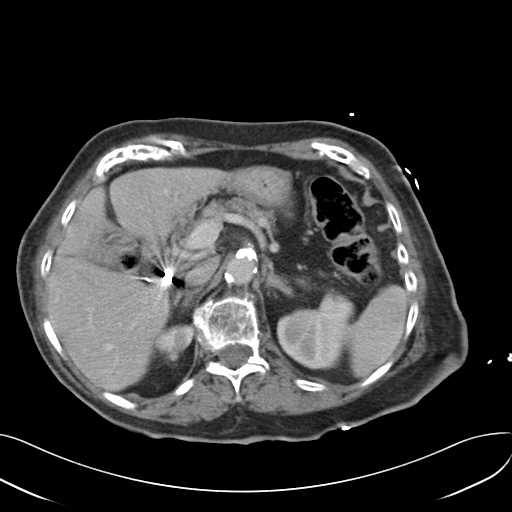
[im 68/88  lung]
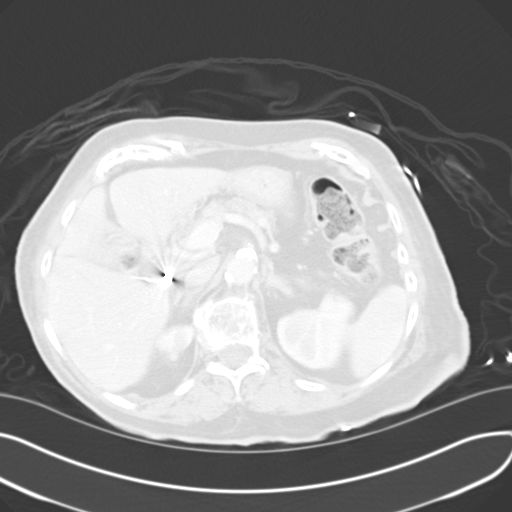
[im 68/88  bone]
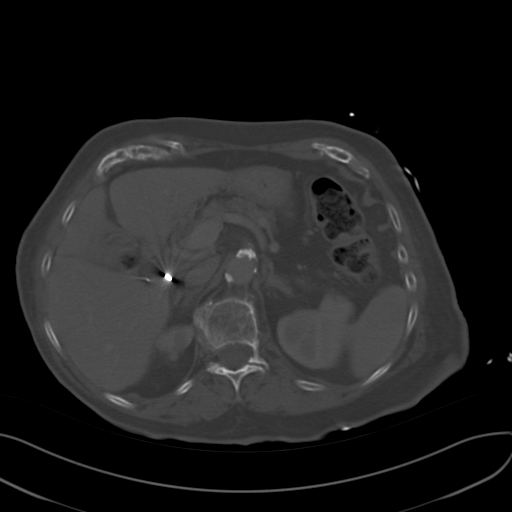
[im 73/88  soft-tissue]
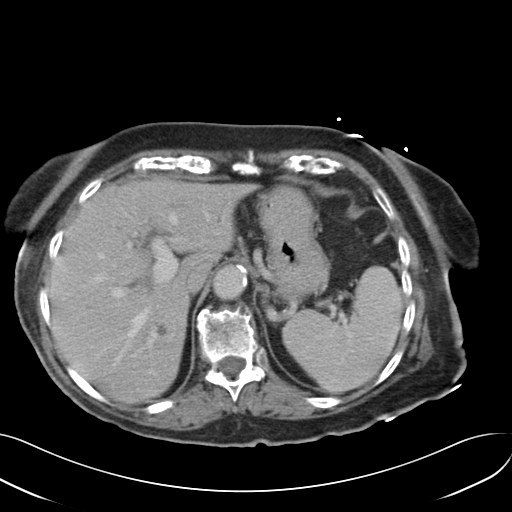
[im 73/88  lung]
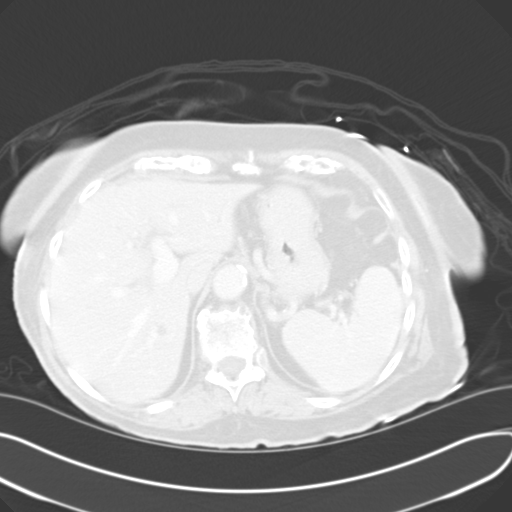
[im 78/88  lung]
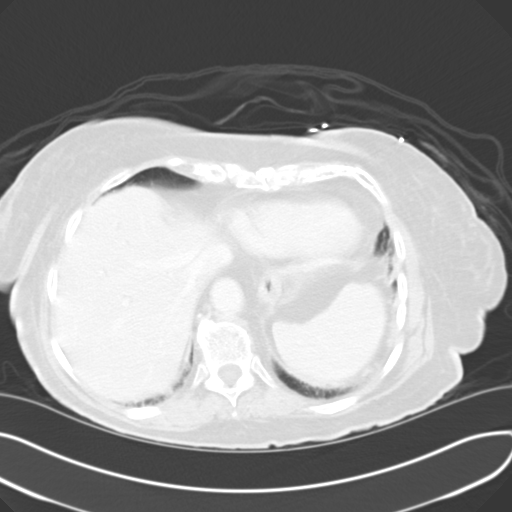
[im 83/88  soft-tissue]
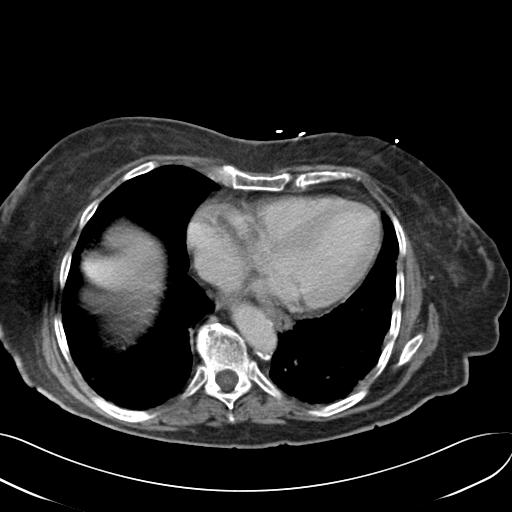
[im 83/88  lung]
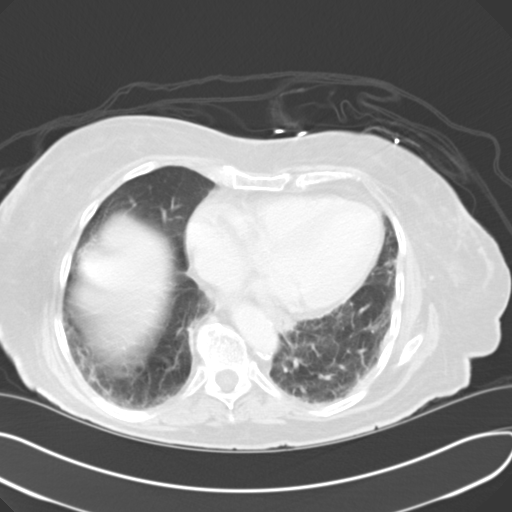

[12 of 32 positions shown; findings below may reference images not displayed]

FINDINGS: Lower chest: Scarring in the lung bases bilaterally. Atherosclerotic
calcifications in the left circumflex coronary artery.
Calcifications of the aortic valve.

Hepatobiliary: Several sub cm low-attenuation liver lesions are
noted, too small to definitively characterize but favored to
represent small cysts. Mild periportal edema. No intra or
extrahepatic biliary ductal dilatation. Status post cholecystectomy.

Pancreas: No pancreatic mass. No pancreatic ductal dilatation. No
pancreatic or peripancreatic fluid or inflammatory changes. Severe
atrophy of the distal body and tail of the pancreas.

Spleen: Unremarkable.

Adrenals/Urinary Tract: Cortical thinning in the upper pole of the
right kidney, presumably post infectious or inflammatory scarring.
There are some tiny parenchymal calcifications in this region which
are likely dystrophic. Sub cm low-attenuation lesion in the lateral
aspect of the upper pole of the left kidney is too small to
definitively characterize, but is statistically likely a small cyst.
No hydroureteronephrosis. Urinary bladder is normal in appearance.

Stomach/Bowel: Normal appearance of the stomach. No pathologic
dilatation of small bowel or colon. Numerous colonic diverticular
noted. Importantly, there are extensive inflammatory changes
adjacent to the distal descending colon and proximal sigmoid colon
in the a region of numerous diverticuli, where there is also
extensive colonic wall thickening, presumably secondary to acute
diverticulitis. No discrete diverticular abscess is confidently
identified at this time. No signs of frank perforation.

Vascular/Lymphatic: Extensive atherosclerosis throughout the
abdominal and pelvic vasculature, without evidence of aneurysm or
dissection. No lymphadenopathy noted in the abdomen or pelvis.

Reproductive: Uterus and ovaries are atrophic. Dilated left gonadal
vein incidentally noted.

Other: Trace volume of ascites in the low anatomic pelvis,
presumably reactive. No larger volume of ascites. No
pneumoperitoneum.

Musculoskeletal: There are no aggressive appearing lytic or blastic
lesions noted in the visualized portions of the skeleton.
IMPRESSION: 1. Colonic diverticulitis at the junction the distal descending
colon and proximal sigmoid colon. No discrete diverticular abscess
is identified at this time. No signs of frank perforation. Given the
extensive colonic wall thickening in this region (which is favored
to be chronic related to chronic diverticular disease), a followup
nonemergent colonoscopy should be considered in the near future
after complete resolution of the patient's acute symptoms to exclude
the possibility of concurrent colonic neoplasm.
2. Mild diffuse periportal edema. This is of uncertain etiology and
significance, but correlation with liver function tests is
suggested.
3. Status post cholecystectomy.
4. Extensive atherosclerosis.
5. Additional incidental findings, as above.

## 2016-11-06 IMAGING — CT CT HEAD W/O CM
1 of 2 series · 13 of 30 positions shown, 17 images · non-contrast
Comparison: Prior CT scan of the head 08/17/2013

CLINICAL DATA: [AGE] female with generalized weakness and
decreased oral intake over the past week

EXAM:
CT HEAD WITHOUT CONTRAST
TECHNIQUE: Contiguous axial images were obtained from the base of the skull
through the vertex without intravenous contrast.

[Series 4: soft tissue recon · axial · 0.42mm/px · z∈[+707,+825]mm · 13 of 30 slices shown, 17 images]
[im 3/30  brain]
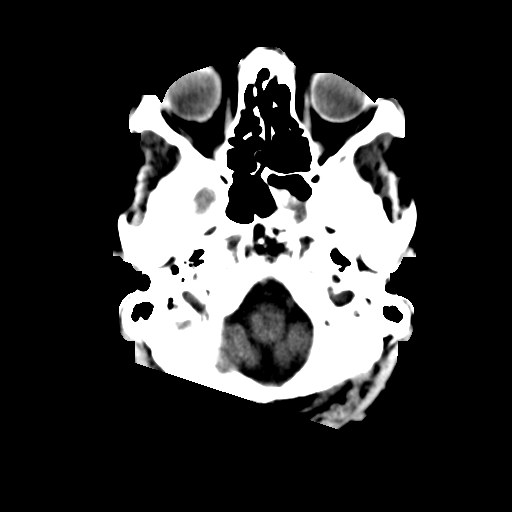
[im 3/30  bone]
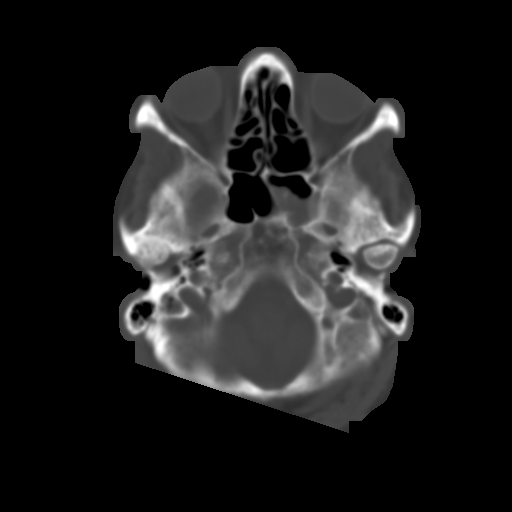
[im 5/30  brain]
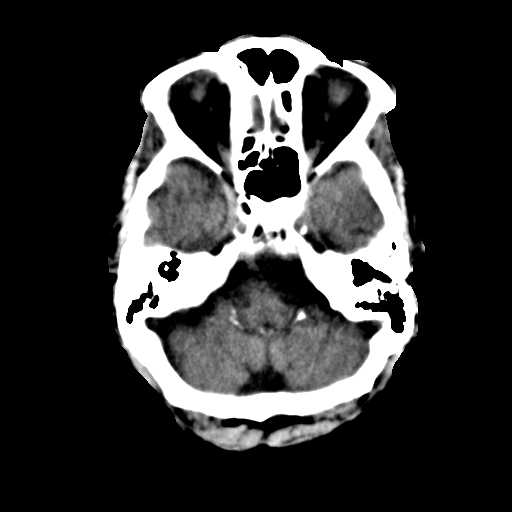
[im 7/30  brain]
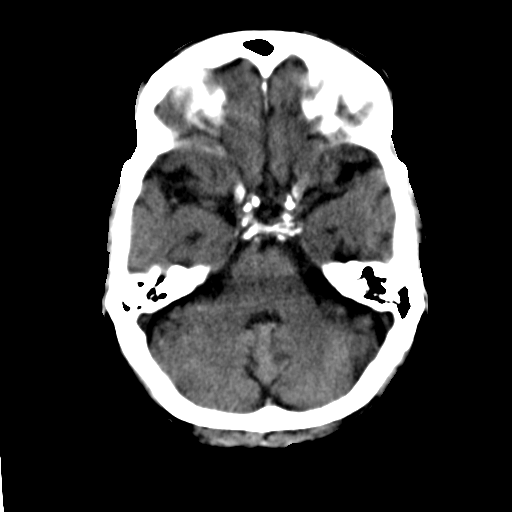
[im 9/30  brain]
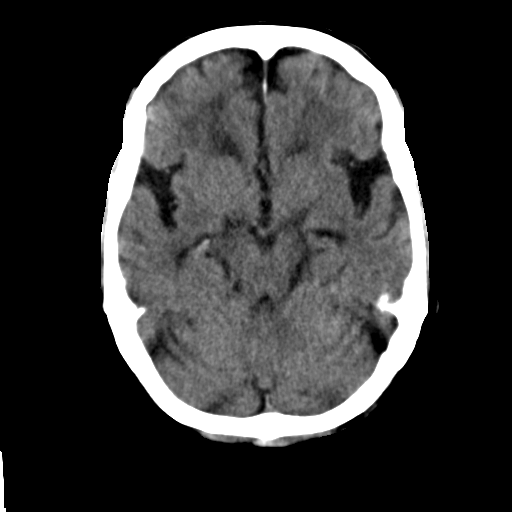
[im 11/30  brain]
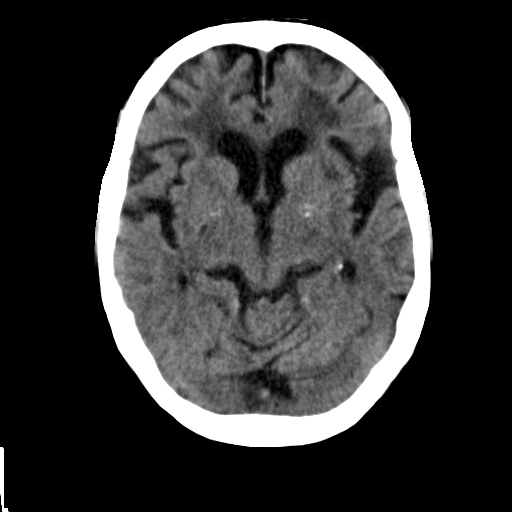
[im 11/30  bone]
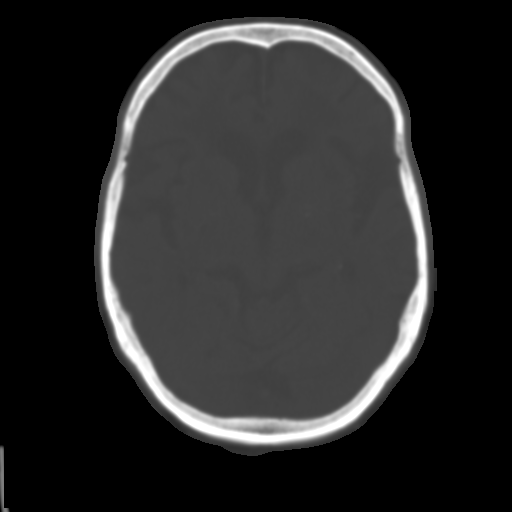
[im 13/30  brain]
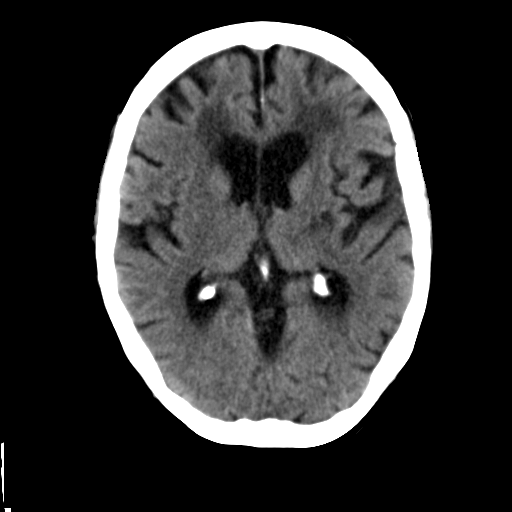
[im 15/30  brain]
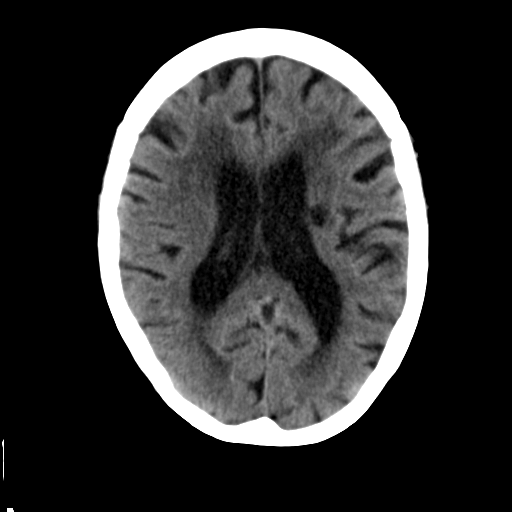
[im 17/30  brain]
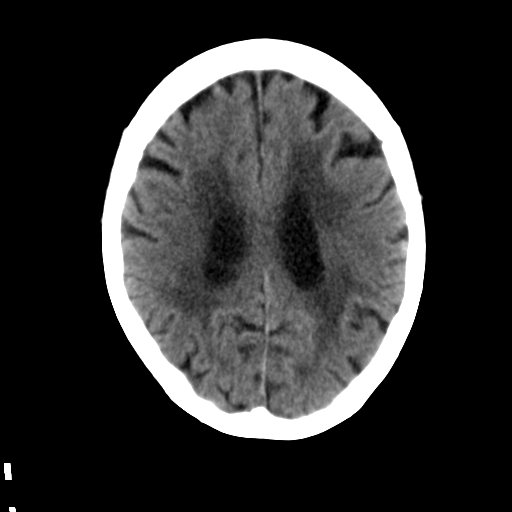
[im 19/30  brain]
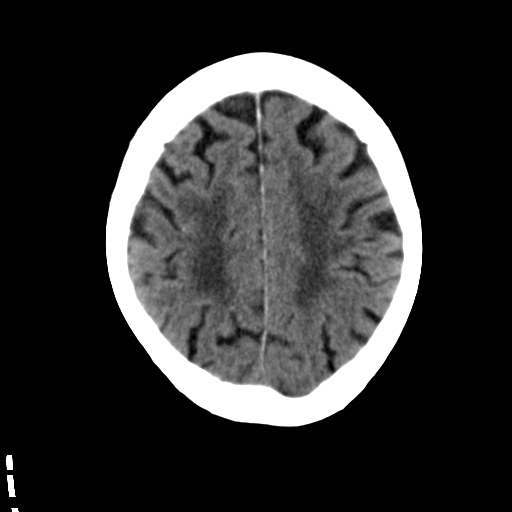
[im 19/30  bone]
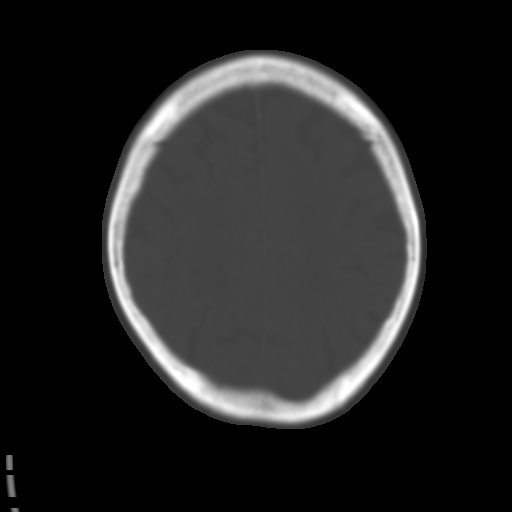
[im 21/30  brain]
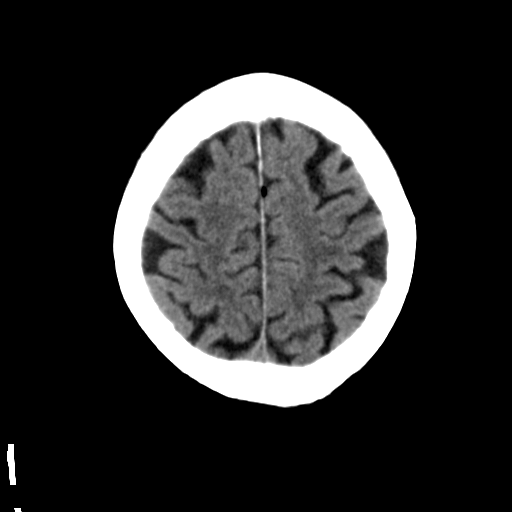
[im 23/30  brain]
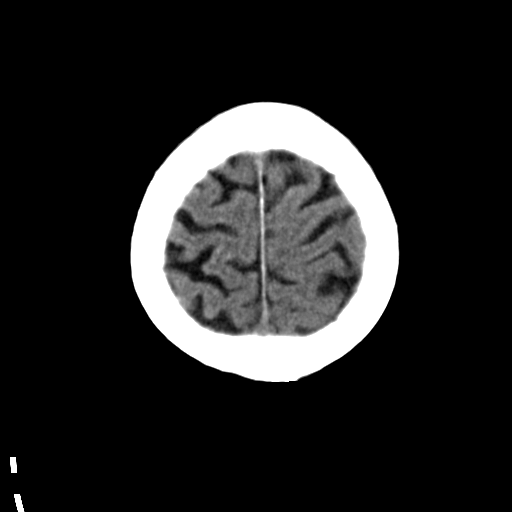
[im 25/30  brain]
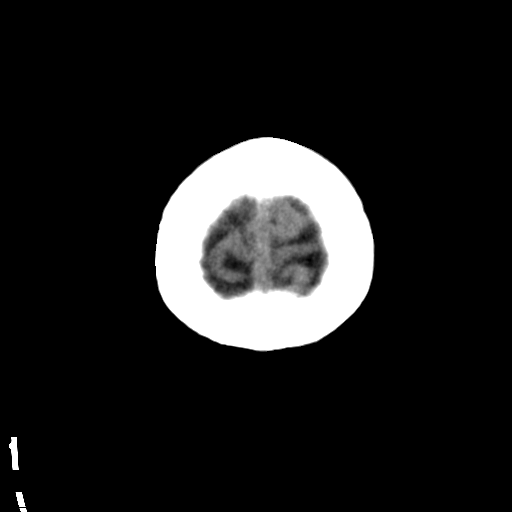
[im 27/30  brain]
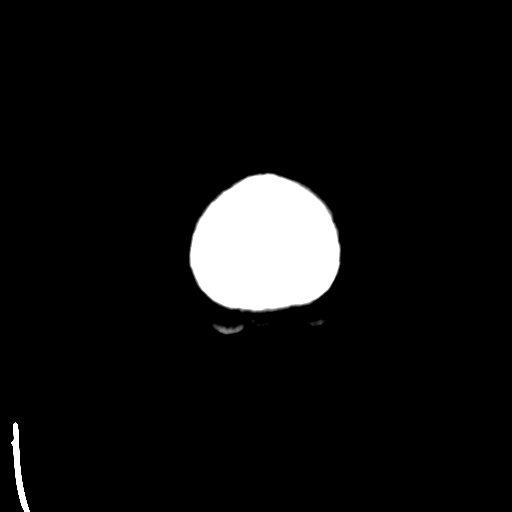
[im 27/30  bone]
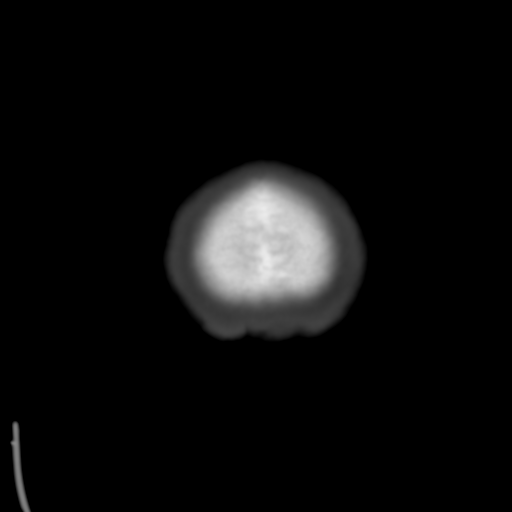

[13 of 30 positions shown; findings below may reference images not displayed]

FINDINGS: Negative for acute intracranial hemorrhage, acute infarction, mass,
mass effect, hydrocephalus or midline shift. Gray-white
differentiation is preserved throughout. Confluent periventricular
and subcortical white matter hypoattenuation most consistent with
advanced chronic microvascular ischemic white matter disease.
Circumscribed hypoattenuation in the left basal ganglia extending
superiorly into the corona radiata consistent with a remote ischemic
insult. This is a new finding compared to 08/17/2013 and has likely
occurred at some point in the interim. No focal soft tissue or
calvarial abnormality. Normal aeration of the bilateral mastoid air
cells and the majority of the paranasal sinuses save for the left
sphenoid air cell. Atherosclerotic calcifications in both cavernous
carotid arteries.
IMPRESSION: 1. No acute intracranial abnormality.
2. Advanced chronic microvascular ischemic white matter disease.
3. Focal encephalomalacia consistent with prior ischemic insult in
the left basal ganglia extending superiorly into the corona radiata
does not appear acute or subacute but is a new finding compared to
08/17/2013 and has likely occurred at some point in the interim
since the prior study.
4. Partial opacification of the left sphenoid air cell.
5. Intracranial atherosclerosis.

## 2017-01-15 IMAGING — CR DG CHEST 1V
1 series · 1 of 1 positions shown · non-contrast
Comparison: 05/15/2015

CLINICAL DATA: PICC line placement.  Sacral decubitus ulcer.

EXAM:
CHEST 1 VIEW

[ap]
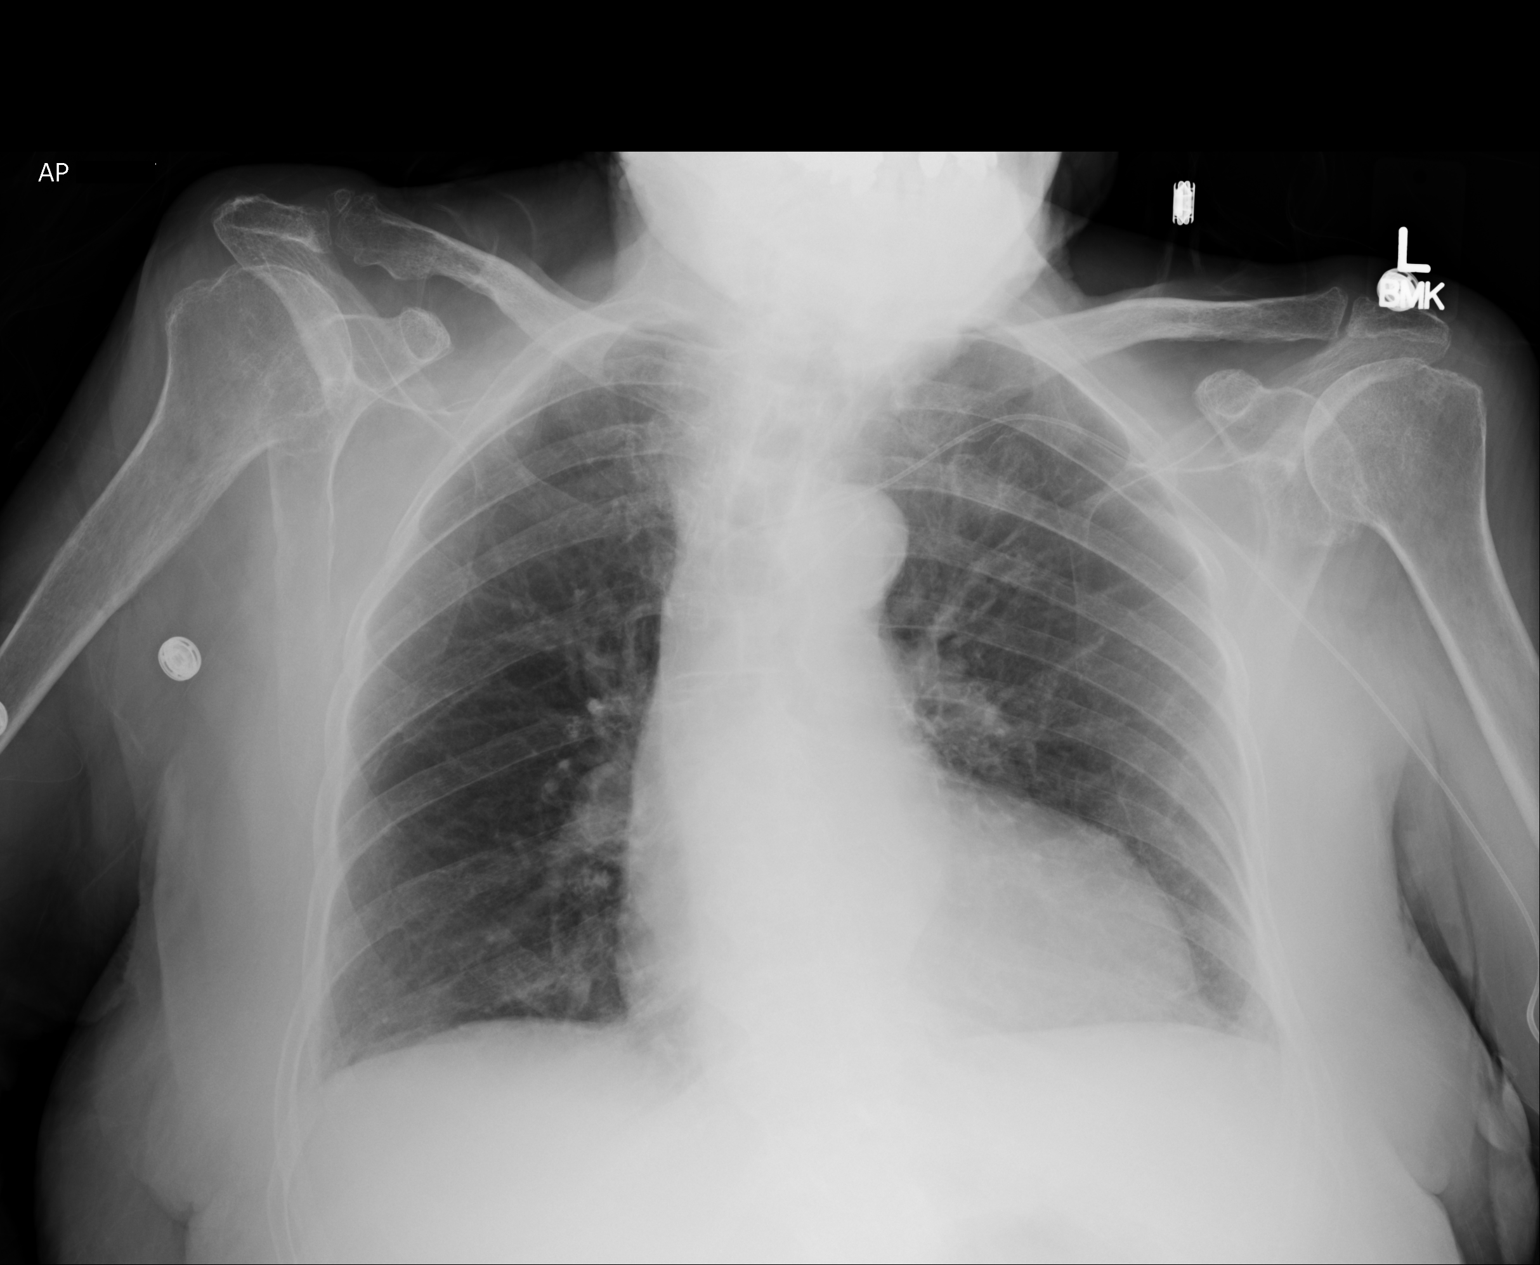

[1 of 1 positions shown; findings below may reference images not displayed]

FINDINGS: Heart size is at the upper limits of normal and stable. Both lungs
are clear. A new left arm PICC line is seen with tip curled back on
itself within the left innominate vein.
IMPRESSION: Left arm PICC line tip is coronal back on itself within the left
innominate vein.

No active lung disease.a
